# Patient Record
Sex: Male | Born: 1985 | Race: White | Hispanic: No | Marital: Married | Smoking: Never smoker
Health system: Southern US, Community
[De-identification: ages and names within clinical notes are randomized; demographics above are authoritative.]

## PROBLEM LIST (undated history)

## (undated) DIAGNOSIS — K219 Gastro-esophageal reflux disease without esophagitis: Secondary | ICD-10-CM

## (undated) DIAGNOSIS — G8929 Other chronic pain: Secondary | ICD-10-CM

## (undated) DIAGNOSIS — F909 Attention-deficit hyperactivity disorder, unspecified type: Secondary | ICD-10-CM

## (undated) DIAGNOSIS — M549 Dorsalgia, unspecified: Secondary | ICD-10-CM

## (undated) HISTORY — PX: MANDIBLE SURGERY: SHX707

## (undated) HISTORY — PX: WISDOM TOOTH EXTRACTION: SHX21

---

## 2013-04-04 ENCOUNTER — Observation Stay (HOSPITAL_COMMUNITY): Payer: Commercial Managed Care - PPO | Admitting: Anesthesiology

## 2013-04-04 ENCOUNTER — Emergency Department (HOSPITAL_COMMUNITY): Payer: Commercial Managed Care - PPO

## 2013-04-04 ENCOUNTER — Encounter (HOSPITAL_COMMUNITY): Admission: EM | Disposition: A | Discharge status: Home / Self Care | Payer: Self-pay | Source: Home / Self Care

## 2013-04-04 ENCOUNTER — Encounter (HOSPITAL_COMMUNITY): Payer: Self-pay | Admitting: Anesthesiology

## 2013-04-04 ENCOUNTER — Encounter (HOSPITAL_COMMUNITY): Payer: Self-pay | Admitting: Emergency Medicine

## 2013-04-04 ENCOUNTER — Observation Stay (HOSPITAL_COMMUNITY)
Admission: EM | Admit: 2013-04-04 | Discharge: 2013-04-05 | Disposition: A | Discharge status: Home / Self Care | Payer: Commercial Managed Care - PPO | Attending: General Surgery | Admitting: General Surgery

## 2013-04-04 DIAGNOSIS — K219 Gastro-esophageal reflux disease without esophagitis: Secondary | ICD-10-CM | POA: Insufficient documentation

## 2013-04-04 DIAGNOSIS — K3580 Unspecified acute appendicitis: Principal | ICD-10-CM

## 2013-04-04 DIAGNOSIS — R109 Unspecified abdominal pain: Secondary | ICD-10-CM

## 2013-04-04 DIAGNOSIS — D72829 Elevated white blood cell count, unspecified: Secondary | ICD-10-CM | POA: Insufficient documentation

## 2013-04-04 DIAGNOSIS — K37 Unspecified appendicitis: Secondary | ICD-10-CM

## 2013-04-04 DIAGNOSIS — Z79899 Other long term (current) drug therapy: Secondary | ICD-10-CM | POA: Insufficient documentation

## 2013-04-04 DIAGNOSIS — Z9049 Acquired absence of other specified parts of digestive tract: Secondary | ICD-10-CM

## 2013-04-04 HISTORY — DX: Other chronic pain: G89.29

## 2013-04-04 HISTORY — DX: Dorsalgia, unspecified: M54.9

## 2013-04-04 HISTORY — DX: Gastro-esophageal reflux disease without esophagitis: K21.9

## 2013-04-04 HISTORY — DX: Attention-deficit hyperactivity disorder, unspecified type: F90.9

## 2013-04-04 HISTORY — PX: LAPAROSCOPIC APPENDECTOMY: SHX408

## 2013-04-04 LAB — CBC WITH DIFFERENTIAL/PLATELET
Basophils Relative: 0 % (ref 0–1)
Eosinophils Relative: 1 % (ref 0–5)
HCT: 42.4 % (ref 39.0–52.0)
Hemoglobin: 14.7 g/dL (ref 13.0–17.0)
Lymphocytes Relative: 19 % (ref 12–46)
MCHC: 34.7 g/dL (ref 30.0–36.0)
MCV: 87.4 fL (ref 78.0–100.0)
Monocytes Absolute: 0.9 10*3/uL (ref 0.1–1.0)
Monocytes Relative: 7 % (ref 3–12)
Neutro Abs: 9.3 10*3/uL — ABNORMAL HIGH (ref 1.7–7.7)

## 2013-04-04 LAB — POCT I-STAT, CHEM 8
Calcium, Ion: 1.18 mmol/L (ref 1.12–1.23)
Glucose, Bld: 90 mg/dL (ref 70–99)
HCT: 45 % (ref 39.0–52.0)
Hemoglobin: 15.3 g/dL (ref 13.0–17.0)

## 2013-04-04 LAB — HEPATIC FUNCTION PANEL
ALT: 15 U/L (ref 0–53)
AST: 21 U/L (ref 0–37)
Bilirubin, Direct: <0.1 mg/dL (ref 0.0–0.3)
Total Bilirubin: 0.4 mg/dL (ref 0.3–1.2)

## 2013-04-04 SURGERY — APPENDECTOMY, LAPAROSCOPIC
Anesthesia: General | Site: Abdomen | Wound class: Contaminated

## 2013-04-04 MED ORDER — SODIUM CHLORIDE 0.9 % IV SOLN
3.0000 g | Freq: Four times a day (QID) | INTRAVENOUS | Status: DC
  Administered 2013-04-04 – 2013-04-05 (×5): 3 g via INTRAVENOUS
  Filled 2013-04-04 (×6): qty 3

## 2013-04-04 MED ORDER — LIDOCAINE HCL (CARDIAC) 20 MG/ML IV SOLN
INTRAVENOUS | Status: DC | PRN
  Administered 2013-04-04: 50 mg via INTRAVENOUS

## 2013-04-04 MED ORDER — ONDANSETRON HCL 4 MG/2ML IJ SOLN
INTRAMUSCULAR | Status: DC | PRN
  Administered 2013-04-04: 4 mg via INTRAVENOUS

## 2013-04-04 MED ORDER — ROCURONIUM BROMIDE 100 MG/10ML IV SOLN
INTRAVENOUS | Status: DC | PRN
  Administered 2013-04-04: 5 mg via INTRAVENOUS
  Administered 2013-04-04: 30 mg via INTRAVENOUS

## 2013-04-04 MED ORDER — SUCCINYLCHOLINE CHLORIDE 20 MG/ML IJ SOLN
INTRAMUSCULAR | Status: DC | PRN
  Administered 2013-04-04: 100 mg via INTRAVENOUS

## 2013-04-04 MED ORDER — MIDAZOLAM HCL 5 MG/5ML IJ SOLN
INTRAMUSCULAR | Status: DC | PRN
  Administered 2013-04-04: 2 mg via INTRAVENOUS

## 2013-04-04 MED ORDER — MORPHINE SULFATE 4 MG/ML IJ SOLN
4.0000 mg | Freq: Once | INTRAMUSCULAR | Status: AC
  Administered 2013-04-04: 4 mg via INTRAVENOUS
  Filled 2013-04-04: qty 1

## 2013-04-04 MED ORDER — HYDROMORPHONE HCL PF 1 MG/ML IJ SOLN
INTRAMUSCULAR | Status: AC
  Filled 2013-04-04: qty 1

## 2013-04-04 MED ORDER — HYDROCODONE-ACETAMINOPHEN 5-325 MG PO TABS
1.0000 | ORAL_TABLET | ORAL | Status: DC | PRN
  Administered 2013-04-04: 2 via ORAL
  Administered 2013-04-04 (×2): 1 via ORAL
  Administered 2013-04-05: 2 via ORAL
  Filled 2013-04-04: qty 2
  Filled 2013-04-04: qty 1
  Filled 2013-04-04: qty 2
  Filled 2013-04-04: qty 1

## 2013-04-04 MED ORDER — HYDROMORPHONE HCL PF 1 MG/ML IJ SOLN
1.0000 mg | Freq: Once | INTRAMUSCULAR | Status: AC
  Administered 2013-04-04: 1 mg via INTRAVENOUS
  Filled 2013-04-04: qty 1

## 2013-04-04 MED ORDER — IOHEXOL 300 MG/ML  SOLN
50.0000 mL | Freq: Once | INTRAMUSCULAR | Status: AC | PRN
  Administered 2013-04-04: 50 mL via ORAL

## 2013-04-04 MED ORDER — BUPIVACAINE-EPINEPHRINE PF 0.25-1:200000 % IJ SOLN
INTRAMUSCULAR | Status: AC
  Filled 2013-04-04: qty 30

## 2013-04-04 MED ORDER — PROPOFOL 10 MG/ML IV BOLUS
INTRAVENOUS | Status: DC | PRN
  Administered 2013-04-04: 150 mg via INTRAVENOUS

## 2013-04-04 MED ORDER — KCL IN DEXTROSE-NACL 30-5-0.45 MEQ/L-%-% IV SOLN
INTRAVENOUS | Status: DC
  Administered 2013-04-04 – 2013-04-05 (×2): via INTRAVENOUS
  Filled 2013-04-04 (×7): qty 1000

## 2013-04-04 MED ORDER — IBUPROFEN 600 MG PO TABS
600.0000 mg | ORAL_TABLET | Freq: Four times a day (QID) | ORAL | Status: DC | PRN
  Filled 2013-04-04: qty 1

## 2013-04-04 MED ORDER — BUPIVACAINE-EPINEPHRINE 0.25% -1:200000 IJ SOLN
INTRAMUSCULAR | Status: DC | PRN
  Administered 2013-04-04: 15 mL

## 2013-04-04 MED ORDER — ONDANSETRON HCL 4 MG PO TABS: 4.0000 mg | ORAL_TABLET | Freq: Four times a day (QID) | ORAL | Status: DC | PRN

## 2013-04-04 MED ORDER — KCL IN DEXTROSE-NACL 20-5-0.45 MEQ/L-%-% IV SOLN: INTRAVENOUS | Status: DC

## 2013-04-04 MED ORDER — FENTANYL CITRATE 0.05 MG/ML IJ SOLN
INTRAMUSCULAR | Status: DC | PRN
  Administered 2013-04-04: 100 ug via INTRAVENOUS
  Administered 2013-04-04 (×3): 50 ug via INTRAVENOUS

## 2013-04-04 MED ORDER — PROMETHAZINE HCL 25 MG/ML IJ SOLN: 6.2500 mg | INTRAMUSCULAR | Status: DC | PRN

## 2013-04-04 MED ORDER — MORPHINE SULFATE 2 MG/ML IJ SOLN
2.0000 mg | INTRAMUSCULAR | Status: DC | PRN
  Administered 2013-04-04 (×4): 2 mg via INTRAVENOUS
  Filled 2013-04-04 (×4): qty 1

## 2013-04-04 MED ORDER — LACTATED RINGERS IV SOLN
INTRAVENOUS | Status: DC | PRN
  Administered 2013-04-04: 11:00:00 via INTRAVENOUS

## 2013-04-04 MED ORDER — IOHEXOL 300 MG/ML  SOLN
100.0000 mL | Freq: Once | INTRAMUSCULAR | Status: AC | PRN
  Administered 2013-04-04: 100 mL via INTRAVENOUS

## 2013-04-04 MED ORDER — ONDANSETRON HCL 4 MG/2ML IJ SOLN
4.0000 mg | Freq: Once | INTRAMUSCULAR | Status: AC
  Administered 2013-04-04: 4 mg via INTRAVENOUS
  Filled 2013-04-04: qty 2

## 2013-04-04 MED ORDER — LACTATED RINGERS IR SOLN
Status: DC | PRN
  Administered 2013-04-04: 1000 mL

## 2013-04-04 MED ORDER — PHENYLEPHRINE HCL 10 MG/ML IJ SOLN
INTRAMUSCULAR | Status: DC | PRN
  Administered 2013-04-04: 40 ug via INTRAVENOUS

## 2013-04-04 MED ORDER — LACTATED RINGERS IV SOLN: INTRAVENOUS | Status: DC

## 2013-04-04 MED ORDER — GLYCOPYRROLATE 0.2 MG/ML IJ SOLN
INTRAMUSCULAR | Status: DC | PRN
  Administered 2013-04-04: .7 mg via INTRAVENOUS

## 2013-04-04 MED ORDER — HYDROMORPHONE HCL PF 1 MG/ML IJ SOLN
0.2500 mg | INTRAMUSCULAR | Status: DC | PRN
  Administered 2013-04-04 (×4): 0.5 mg via INTRAVENOUS

## 2013-04-04 MED ORDER — SODIUM CHLORIDE 0.9 % IV SOLN
INTRAVENOUS | Status: DC | PRN
  Administered 2013-04-04: 04:00:00 via INTRAVENOUS

## 2013-04-04 MED ORDER — SODIUM CHLORIDE 0.9 % IV SOLN: 3.0000 g | Freq: Four times a day (QID) | INTRAVENOUS | Status: DC

## 2013-04-04 MED ORDER — SODIUM CHLORIDE 0.9 % IV SOLN
Freq: Once | INTRAVENOUS | Status: AC
  Administered 2013-04-04: 04:00:00 via INTRAVENOUS

## 2013-04-04 MED ORDER — ONDANSETRON HCL 4 MG/2ML IJ SOLN
4.0000 mg | Freq: Four times a day (QID) | INTRAMUSCULAR | Status: DC | PRN
Start: 2013-04-04 — End: 2013-04-05
  Administered 2013-04-04: 4 mg via INTRAVENOUS
  Filled 2013-04-04: qty 2

## 2013-04-04 MED ORDER — NEOSTIGMINE METHYLSULFATE 1 MG/ML IJ SOLN
INTRAMUSCULAR | Status: DC | PRN
  Administered 2013-04-04: 4 mg via INTRAVENOUS

## 2013-04-04 SURGICAL SUPPLY — 38 items
APPLIER CLIP ROT 10 11.4 M/L (STAPLE)
BENZOIN TINCTURE PRP APPL 2/3 (GAUZE/BANDAGES/DRESSINGS) ×2 IMPLANT
CANISTER SUCTION 2500CC (MISCELLANEOUS) ×2 IMPLANT
CLIP APPLIE ROT 10 11.4 M/L (STAPLE) IMPLANT
CLOTH BEACON ORANGE TIMEOUT ST (SAFETY) ×2 IMPLANT
CUTTER FLEX LINEAR 45M (STAPLE) ×2 IMPLANT
DECANTER SPIKE VIAL GLASS SM (MISCELLANEOUS) IMPLANT
DRAPE LAPAROSCOPIC ABDOMINAL (DRAPES) ×2 IMPLANT
DRSG TEGADERM 2-3/8X2-3/4 SM (GAUZE/BANDAGES/DRESSINGS) ×6 IMPLANT
ELECT REM PT RETURN 9FT ADLT (ELECTROSURGICAL) ×2
ELECTRODE REM PT RTRN 9FT ADLT (ELECTROSURGICAL) ×1 IMPLANT
ENDOLOOP SUT PDS II  0 18 (SUTURE)
ENDOLOOP SUT PDS II 0 18 (SUTURE) IMPLANT
GLOVE BIOGEL PI IND STRL 7.0 (GLOVE) ×1 IMPLANT
GLOVE BIOGEL PI INDICATOR 7.0 (GLOVE) ×1
GLOVE SURG ORTHO 8.0 STRL STRW (GLOVE) ×2 IMPLANT
GOWN STRL NON-REIN LRG LVL3 (GOWN DISPOSABLE) ×2 IMPLANT
GOWN STRL REIN XL XLG (GOWN DISPOSABLE) ×2 IMPLANT
HAND ACTIVATED (MISCELLANEOUS) IMPLANT
KIT BASIN OR (CUSTOM PROCEDURE TRAY) ×2 IMPLANT
PENCIL BUTTON HOLSTER BLD 10FT (ELECTRODE) IMPLANT
POUCH SPECIMEN RETRIEVAL 10MM (ENDOMECHANICALS) IMPLANT
RELOAD 45 VASCULAR/THIN (ENDOMECHANICALS) IMPLANT
RELOAD STAPLE TA45 3.5 REG BLU (ENDOMECHANICALS) ×2 IMPLANT
SCALPEL HARMONIC ACE (MISCELLANEOUS) ×2 IMPLANT
SET IRRIG TUBING LAPAROSCOPIC (IRRIGATION / IRRIGATOR) ×2 IMPLANT
SOLUTION ANTI FOG 6CC (MISCELLANEOUS) ×2 IMPLANT
STRIP CLOSURE SKIN 1/2X4 (GAUZE/BANDAGES/DRESSINGS) ×2 IMPLANT
SUT MNCRL AB 4-0 PS2 18 (SUTURE) ×2 IMPLANT
TOWEL OR 17X26 10 PK STRL BLUE (TOWEL DISPOSABLE) ×2 IMPLANT
TRAY FOLEY CATH 14FRSI W/METER (CATHETERS) ×2 IMPLANT
TRAY LAP CHOLE (CUSTOM PROCEDURE TRAY) ×2 IMPLANT
TROCAR BLADELESS OPT 5 75 (ENDOMECHANICALS) ×2 IMPLANT
TROCAR XCEL 12X100 BLDLESS (ENDOMECHANICALS) ×2 IMPLANT
TROCAR XCEL BLUNT TIP 100MML (ENDOMECHANICALS) ×2 IMPLANT
TROCAR XCEL NON-BLD 11X100MML (ENDOMECHANICALS) IMPLANT
TUBING INSUFFLATION 10FT LAP (TUBING) ×2 IMPLANT
WATER STERILE IRR 1500ML POUR (IV SOLUTION) IMPLANT

## 2013-04-04 NOTE — ED Provider Notes (Signed)
7:04 AM Spoke with Surgery (Dr Gerrit Friends). Will see in the ER  Lyanne Co, MD 04/04/13 [phone]

## 2013-04-04 NOTE — ED Provider Notes (Signed)
Medical screening examination/treatment/procedure(s) were conducted as a shared visit with non-physician practitioner(s) and myself.  I personally evaluated the patient during the encounter  CT scan demonstrates appendicitis.  Tenderness in the right lower quadrant.  Surgery to evaluate  1. Appendicitis    Ct Abdomen Pelvis W Contrast  04/04/2013   *RADIOLOGY REPORT*  Clinical Data: Right lower quadrant abdominal pain and leukocytosis.  CT ABDOMEN AND PELVIS WITH CONTRAST  Technique:  Multidetector CT imaging of the abdomen and pelvis was performed following the standard protocol during bolus administration of intravenous contrast.  Contrast: OMNIPAQUE IOHEXOL 300 MG/ML  SOLN  Comparison: None.  Findings: The visualized lung bases are clear.  The liver and spleen are unremarkable in appearance.  The gallbladder is within normal limits.  The pancreas and adrenal glands are unremarkable.  The kidneys are unremarkable in appearance.  There is no evidence of hydronephrosis.  No renal or ureteral stones are seen.  No perinephric stranding is appreciated.  No free fluid is identified.  The small bowel is unremarkable in appearance.  The stomach is within normal limits.  No acute vascular abnormalities are seen.  The appendix is dilated distally to 1.6 cm in maximal diameter, with an obstructing 1.0 cm appendicolith at the mid appendix. Surrounding soft tissue inflammation is noted, with associated pericecal lymphadenopathy.  Findings are compatible with acute appendicitis.  There is no evidence of perforation or abscess formation at this time.  The colon is unremarkable in appearance.  The bladder is mildly distended and grossly unremarkable in appearance.  The prostate remains normal in size.  No inguinal lymphadenopathy is seen.  No acute osseous abnormalities are identified.  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  IMPRESSION:  1.  Acute appendicitis, with dilatation of the distal appendix to 1.6 cm, and an obstructing 1.0 cm appendicolith at the mid appendix.  Surrounding soft tissue inflammation and associated pericecal lymphadenopathy.  No evidence of perforation or abscess formation at this time. 2.  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  These results were called by telephone on 04/04/2013 at 06:45 a.m. to Dr. Azalia Bilis, who verbally acknowledged these results.   Original Report Authenticated By: Tonia Ghent, M.D.   I personally reviewed the imaging tests through PACS system I reviewed available ER/hospitalization records through the EMR   Lyanne Co, MD 04/04/13 [phone]

## 2013-04-04 NOTE — Anesthesia Postprocedure Evaluation (Signed)
  Anesthesia Post-op Note  Patient: Gregg King  Procedure(s) Performed: Procedure(s) (LRB): APPENDECTOMY LAPAROSCOPIC (N/A)  Patient Location: PACU  Anesthesia Type: General  Level of Consciousness: awake and alert   Airway and Oxygen Therapy: Patient Spontanous Breathing  Post-op Pain: mild  Post-op Assessment: Post-op Vital signs reviewed, Patient's Cardiovascular Status Stable, Respiratory Function Stable, Patent Airway and No signs of Nausea or vomiting  Last Vitals:  Filed Vitals:   04/04/13 1115  BP: 112/73  Pulse: 80  Temp: 36.8 C  Resp: 16    Post-op Vital Signs: stable   Complications: No apparent anesthesia complications

## 2013-04-04 NOTE — Progress Notes (Signed)
pacu nursing -  Glasses returned to patient.  Placed on face Parents invited in to see patient

## 2013-04-04 NOTE — ED Notes (Signed)
Pt c/o mid to RLQ pain onset 2300, + nausea.

## 2013-04-04 NOTE — Anesthesia Preprocedure Evaluation (Signed)
Anesthesia Evaluation  Patient identified by MRN, date of birth, ID band Patient awake    Reviewed: Allergy & Precautions, H&P , NPO status , Patient's Chart, lab work & pertinent test results  Airway Mallampati: II TM Distance: >3 FB Neck ROM: Full    Dental  (+) Teeth Intact and Dental Advisory Given   Pulmonary neg pulmonary ROS,  breath sounds clear to auscultation  Pulmonary exam normal       Cardiovascular negative cardio ROS  Rhythm:Regular Rate:Normal     Neuro/Psych negative neurological ROS  negative psych ROS   GI/Hepatic Neg liver ROS, GERD-  Medicated and Controlled,  Endo/Other  negative endocrine ROS  Renal/GU negative Renal ROS  negative genitourinary   Musculoskeletal negative musculoskeletal ROS (+)   Abdominal   Peds  Hematology negative hematology ROS (+)   Anesthesia Other Findings   Reproductive/Obstetrics                           Anesthesia Physical Anesthesia Plan  ASA: I and emergent  Anesthesia Plan: General   Post-op Pain Management:    Induction: Intravenous, Rapid sequence and Cricoid pressure planned  Airway Management Planned: Oral ETT  Additional Equipment:   Intra-op Plan:   Post-operative Plan: Extubation in OR  Informed Consent: I have reviewed the patients History and Physical, chart, labs and discussed the procedure including the risks, benefits and alternatives for the proposed anesthesia with the patient or authorized representative who has indicated his/her understanding and acceptance.   Dental advisory given  Plan Discussed with: CRNA  Anesthesia Plan Comments:         Anesthesia Quick Evaluation

## 2013-04-04 NOTE — H&P (Signed)
Gregg King is an 27 y.o. male.    General Surgery [facility]  Chief Complaint: abdominal pain, acute appendicitis  HPI: the patient is a 27 year old white male who presents to the emergency department with less than 24-hour history of generalized abdominal pain localizing to the right lower quadrant. The patient experienced mild nausea but no emesis. He denies fevers or chills. He is accompanied by his wife. Evaluation in the emergency department shows an elevated white blood cell count of 12.7. CT scan of the abdomen and pelvis shows findings consistent with acute appendicitis with appendicolith. General surgery is now called for management.  Past Medical History  Diagnosis Date  . Chronic back pain   . GERD (gastroesophageal reflux disease)   . ADHD (attention deficit hyperactivity disorder)     Past Surgical History  Procedure Laterality Date  . Wisdom tooth extraction    . Mandible surgery      No family history on file. Social History:  reports that he has never smoked. He does not have any smokeless tobacco history on file. He reports that  drinks alcohol. He reports that he does not use illicit drugs.  Allergies:  Allergies  Allergen Reactions  . Ceclor (Cefaclor) Nausea And Vomiting     (Not in a hospital admission)  Results for orders placed during the hospital encounter of 04/04/13 (from the past 48 hour(s))  CBC WITH DIFFERENTIAL     Status: Abnormal   Collection Time    04/04/13  4:20 AM      Result Value Range   WBC 12.7 (*) 4.0 - 10.5 K/uL   RBC 4.85  4.22 - 5.81 MIL/uL   Hemoglobin 14.7  13.0 - 17.0 g/dL   HCT 16.1  09.6 - 04.5 %   MCV 87.4  78.0 - 100.0 fL   MCH 30.3  26.0 - 34.0 pg   MCHC 34.7  30.0 - 36.0 g/dL   RDW 40.9  81.1 - 91.4 %   Platelets 198  150 - 400 K/uL   Neutrophils Relative % 73  43 - 77 %   Neutro Abs 9.3 (*) 1.7 - 7.7 K/uL   Lymphocytes Relative 19  12 - 46 %   Lymphs Abs 2.4  0.7 - 4.0 K/uL   Monocytes  Relative 7  3 - 12 %   Monocytes Absolute 0.9  0.1 - 1.0 K/uL   Eosinophils Relative 1  0 - 5 %   Eosinophils Absolute 0.2  0.0 - 0.7 K/uL   Basophils Relative 0  0 - 1 %   Basophils Absolute 0.0  0.0 - 0.1 K/uL  HEPATIC FUNCTION PANEL     Status: None   Collection Time    04/04/13  4:20 AM      Result Value Range   Total Protein 7.2  6.0 - 8.3 g/dL   Albumin 4.0  3.5 - 5.2 g/dL   AST 21  0 - 37 U/L   ALT 15  0 - 53 U/L   Alkaline Phosphatase 72  39 - 117 U/L   Total Bilirubin 0.4  0.3 - 1.2 mg/dL   Bilirubin, Direct <7.8  0.0 - 0.3 mg/dL   Indirect Bilirubin NOT CALCULATED  0.3 - 0.9 mg/dL  POCT I-STAT, CHEM 8     Status: None   Collection Time    04/04/13  4:41 AM      Result Value Range   Sodium 142  135 - 145 mEq/L  Potassium 3.9  3.5 - 5.1 mEq/L   Chloride 104  96 - 112 mEq/L   BUN 10  6 - 23 mg/dL   Creatinine, Ser 1.61  0.50 - 1.35 mg/dL   Glucose, Bld 90  70 - 99 mg/dL   Calcium, Ion 0.96  0.45 - 1.23 mmol/L   TCO2 31  0 - 100 mmol/L   Hemoglobin 15.3  13.0 - 17.0 g/dL   HCT 40.9  81.1 - 91.4 %   Ct Abdomen Pelvis W Contrast  04/04/2013   *RADIOLOGY REPORT*  Clinical Data: Right lower quadrant abdominal pain and leukocytosis.  CT ABDOMEN AND PELVIS WITH CONTRAST  Technique:  Multidetector CT imaging of the abdomen and pelvis was performed following the standard protocol during bolus administration of intravenous contrast.  Contrast: OMNIPAQUE IOHEXOL 300 MG/ML  SOLN  Comparison: None.  Findings: The visualized lung bases are clear.  The liver and spleen are unremarkable in appearance.  The gallbladder is within normal limits.  The pancreas and adrenal glands are unremarkable.  The kidneys are unremarkable in appearance.  There is no evidence of hydronephrosis.  No renal or ureteral stones are seen.  No perinephric stranding is appreciated.  No free fluid is identified.  The small bowel is unremarkable in appearance.  The stomach is within normal limits.  No acute  vascular abnormalities are seen.  The appendix is dilated distally to 1.6 cm in maximal diameter, with an obstructing 1.0 cm appendicolith at the mid appendix. Surrounding soft tissue inflammation is noted, with associated pericecal lymphadenopathy.  Findings are compatible with acute appendicitis.  There is no evidence of perforation or abscess formation at this time.  The colon is unremarkable in appearance.  The bladder is mildly distended and grossly unremarkable in appearance.  The prostate remains normal in size.  No inguinal lymphadenopathy is seen.  No acute osseous abnormalities are identified.  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  IMPRESSION:  1.  Acute appendicitis, with dilatation of the distal appendix to 1.6 cm, and an obstructing 1.0 cm appendicolith at the mid appendix.  Surrounding soft tissue inflammation and associated pericecal lymphadenopathy.  No evidence of perforation or abscess formation at this time. 2.  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  These results were called by telephone on 04/04/2013 at 06:45 a.m. to Dr. Azalia Bilis, who verbally acknowledged these results.   Original Report Authenticated By: Tonia Ghent, M.D.    Review of Systems  Constitutional: Negative.   HENT: Negative.   Eyes: Negative.   Respiratory: Negative.   Cardiovascular: Negative.   Gastrointestinal: Positive for heartburn, nausea and abdominal pain (generalized localizing to RLQ). Negative for vomiting, diarrhea, constipation, blood in stool and melena.  Genitourinary: Negative.   Musculoskeletal: Negative.   Skin: Negative.   Neurological: Negative.   Endo/Heme/Allergies: Negative.   Psychiatric/Behavioral: Negative.     Blood pressure 125/82, pulse 87, temperature 99 F (37.2 C), temperature source Oral, resp. rate  18, height 6' (1.829 m), weight 185 lb (83.915 kg), SpO2 99.00%. Physical Exam  Constitutional: He is oriented to person, place, and time. He appears well-developed and well-nourished. No distress.  HENT:  Head: Normocephalic and atraumatic.  Right Ear: External ear normal.  Mouth/Throat: Oropharynx is clear and moist.  Eyes: Conjunctivae are normal. Pupils are equal, round, and reactive to light.  No scleral icterus.  Neck: Normal range of motion. Neck supple. No tracheal deviation present. No thyromegaly present.  Cardiovascular: Normal rate, regular rhythm and normal heart sounds.   No murmur heard. Respiratory: Effort normal and breath sounds normal. He has no wheezes.  GI: Soft. Bowel sounds are normal. He exhibits no distension and no mass. There is tenderness (RLQ tenderness with guarding, no mass). There is guarding. There is no rebound.  Musculoskeletal: Normal range of motion. He exhibits no edema.  Neurological: He is alert and oriented to person, place, and time.  Skin: Skin is warm and dry.  Psychiatric: He has a normal mood and affect. His behavior is normal.     Assessment/Plan Acute appendicitis  Admit to general surgery service  Initiate IV abx with Unasyn  To OR this AM for lap appendectomy  The risks and benefits of the procedure have been discussed at length with the patient.  The patient understands the proposed procedure, potential alternative treatments, and the course of recovery to be expected.  All of the patient's questions have been answered at this time.  The patient wishes to proceed with surgery.  Velora Heckler, MD, [facility] Office: [phone]    Wilfred Dayrit Judie Petit 04/04/2013, 8:25 AM

## 2013-04-04 NOTE — Transfer of Care (Signed)
Immediate Anesthesia Transfer of Care Note  Patient: Gregg King  Procedure(s) Performed: Procedure(s) (LRB): APPENDECTOMY LAPAROSCOPIC (N/A)  Patient Location: PACU  Anesthesia Type: General  Level of Consciousness: sedated, patient cooperative and responds to stimulaton  Airway & Oxygen Therapy: Patient Spontanous Breathing and Patient connected to face mask oxgen  Post-op Assessment: Report given to PACU RN and Post -op Vital signs reviewed and stable  Post vital signs: Reviewed and stable  Complications: No apparent anesthesia complications

## 2013-04-04 NOTE — ED Provider Notes (Signed)
History     CSN: [id number]  Arrival date & time 04/04/13  0335   First MD Initiated Contact with Patient 04/04/13 [phone]      Chief Complaint  Patient presents with  . Abdominal Pain    (Consider location/radiation/quality/duration/timing/severity/associated sxs/prior treatment) HPI Comments: Patient with generalized abdominal pain starting about midnight tried taking his nightly meds including Flexeril and Prilosec only to have the pain worsen has had several episodes of vomiting, feeling lightheaded and localization of his pain to RLQ  Patient is a 27 y.o. male presenting with abdominal pain. The history is provided by the patient.  Abdominal Pain This is a new problem. The current episode started today. The problem occurs constantly. The problem has been gradually worsening. Associated symptoms include abdominal pain, nausea, vomiting and weakness. Pertinent negatives include no chills, coughing, fever, headaches, rash or urinary symptoms. The symptoms are aggravated by exertion. He has tried lying down for the symptoms. The treatment provided no relief.    Past Medical History  Diagnosis Date  . Chronic back pain   . GERD (gastroesophageal reflux disease)   . ADHD (attention deficit hyperactivity disorder)     Past Surgical History  Procedure Laterality Date  . Wisdom tooth extraction    . Mandible surgery      History reviewed. No pertinent family history.  History  Substance Use Topics  . Smoking status: Never Smoker   . Smokeless tobacco: Never Used  . Alcohol Use: Yes     Comment: rare      Review of Systems  Unable to perform ROS Constitutional: Negative for fever and chills.  Respiratory: Negative for cough and shortness of breath.   Gastrointestinal: Positive for nausea, vomiting and abdominal pain. Negative for diarrhea, constipation and blood in stool.  Genitourinary: Negative for dysuria, discharge and testicular pain.  Skin: Negative for rash.   Neurological: Positive for weakness. Negative for dizziness and headaches.  All other systems reviewed and are negative.    Allergies  Ceclor  Home Medications   No current outpatient prescriptions on file.  BP 98/64  Pulse 86  Temp(Src) 97.5 F (36.4 C) (Oral)  Resp 16  Ht 6' (1.829 m)  Wt 185 lb (83.915 kg)  BMI 25.08 kg/m2  SpO2 97%  Physical Exam  Nursing note and vitals reviewed. Constitutional: He is oriented to person, place, and time. He appears well-developed and well-nourished.  HENT:  Head: Normocephalic.  Eyes: Pupils are equal, round, and reactive to light.  Neck: Normal range of motion.  Cardiovascular: Normal rate and regular rhythm.   Pulmonary/Chest: Effort normal.  Abdominal: Soft. He exhibits no distension. There is no hepatosplenomegaly. There is tenderness in the right lower quadrant and suprapubic area. There is no rebound.  Musculoskeletal: Normal range of motion.  Neurological: He is alert and oriented to person, place, and time.  Skin: Skin is warm and dry. There is pallor.    ED Course  Procedures (including critical care time)  Labs Reviewed  CBC WITH DIFFERENTIAL - Abnormal; Notable for the following:    WBC 12.7 (*)    Neutro Abs 9.3 (*)    All other components within normal limits  HEPATIC FUNCTION PANEL  POCT I-STAT, CHEM 8   Ct Abdomen Pelvis W Contrast  04/04/2013   *RADIOLOGY REPORT*  Clinical Data: Right lower quadrant abdominal pain and leukocytosis.  CT ABDOMEN AND PELVIS WITH CONTRAST  Technique:  Multidetector CT imaging of the abdomen and pelvis was performed following the  standard protocol during bolus administration of intravenous contrast.  Contrast: OMNIPAQUE IOHEXOL 300 MG/ML  SOLN  Comparison: None.  Findings: The visualized lung bases are clear.  The liver and spleen are unremarkable in appearance.  The gallbladder is within normal limits.  The pancreas and adrenal glands are unremarkable.  The kidneys are  unremarkable in appearance.  There is no evidence of hydronephrosis.  No renal or ureteral stones are seen.  No perinephric stranding is appreciated.  No free fluid is identified.  The small bowel is unremarkable in appearance.  The stomach is within normal limits.  No acute vascular abnormalities are seen.  The appendix is dilated distally to 1.6 cm in maximal diameter, with an obstructing 1.0 cm appendicolith at the mid appendix. Surrounding soft tissue inflammation is noted, with associated pericecal lymphadenopathy.  Findings are compatible with acute appendicitis.  There is no evidence of perforation or abscess formation at this time.  The colon is unremarkable in appearance.  The bladder is mildly distended and grossly unremarkable in appearance.  The prostate remains normal in size.  No inguinal lymphadenopathy is seen.  No acute osseous abnormalities are identified.  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  IMPRESSION:  1.  Acute appendicitis, with dilatation of the distal appendix to 1.6 cm, and an obstructing 1.0 cm appendicolith at the mid appendix.  Surrounding soft tissue inflammation and associated pericecal lymphadenopathy.  No evidence of perforation or abscess formation at this time. 2.  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  These results were called by telephone on 04/04/2013 at 06:45 a.m. to Dr. Azalia Bilis, who verbally acknowledged these results.   Original Report Authenticated By: Tonia Ghent, M.D.     1. Appendicitis       MDM  Denies UTI symptoms, testicular discomfort penile discharge or dysuria          Arman Filter, NP 04/04/13 2057

## 2013-04-04 NOTE — Op Note (Signed)
OPERATIVE REPORT - LAPAROSCOPIC APPENDECTOMY  Preop diagnosis: Acute appendicitis  Postop diagnosis: Same  Procedure: Laparoscopic appendectomy  Surgeon:  Velora Heckler, MD, FACS  Anesthesia: General endotracheal  Estimated blood loss: Minimal  Preparation: Chlora-prep  Complications: None  Indications:  Patient is a 27 yo WM who presented to ER with less than 24 hour history of abdominal pain localizing to RLQ.  WBC elevated at 12.7.  CTA shows appendicolith with signs of acute appendicitis.  Now for lap appendectomy.  Procedure:  Patient is brought to the operating room and placed in a supine position on the operating room table. Following administration of general anesthesia, a time out was held and the patient's name and type of procedure is confirmed. Patient is then prepped and draped in the usual strict aseptic fashion.  After ascertaining that an adequate level of anesthesia been achieved, a peri-umbilical incision is made with a #15 blade. Dissection is carried down to the fascia. Fascia is incised in the midline and the peritoneal cavity is entered cautiously. A #0-vicryl pursestring suture is placed in the fascia. An Hassan cannula is introduced under direct vision and secured with the pursestring suture. Abdomen is insufflated with carbon dioxide. Laparoscope is introduced and the abdomen is explored. Operative ports are placed in the right upper quadrant and left lower quadrant. Appendix is identified.  Dissection is carried down to the base of the appendix. The base of the appendix at its junction with the cecal wall was clearly identified. Using an Endo GIA stapler the base of the appendix is transected at its junction with the cecal wall. There is good approximation of tissue along the staple line. There is good hemostasis along the staple line.   The appendix is acutely inflamed and adherent to the mesentery of the terminal ileum and retroperitoneum.  With careful dissection,  the mesoappendix is dissected out and transected with the Harmonic Scalpel.  Good hemostasis is noted. There is no abscess present.  The appendix is placed into an endo-catch bag and withdrawn through the umbilical port without difficulty. The #0-vicryl pursestring suture is tied securely.  Right lower quadrant is irrigated with warm saline which is evacuated. Good hemostasis is noted. Ports are removed under direct vision. Good hemostasis is noted at the port sites. Pneumoperitoneum is released.  Skin incisions are anesthetized with local anesthetic. Wounds are closed with interrupted 4-0 Monocryl subcuticular sutures. Wounds were washed and dried and benzoin and Steri-Strips are applied. Sterile dressings are applied. Patient is awakened from anesthesia and brought to the recovery room. The patient tolerated the procedure well.  Velora Heckler, MD, [facility] Office: [phone]

## 2013-04-04 NOTE — Progress Notes (Signed)
Glasses taken to PACU, pt. Family unavailable.

## 2013-04-05 ENCOUNTER — Encounter (HOSPITAL_COMMUNITY): Payer: Self-pay | Admitting: Surgery

## 2013-04-05 MED ORDER — HYDROCODONE-ACETAMINOPHEN 5-325 MG PO TABS: 1.0000 | ORAL_TABLET | ORAL | Status: DC | PRN

## 2013-04-05 MED ORDER — IBUPROFEN 600 MG PO TABS: 600.0000 mg | ORAL_TABLET | Freq: Four times a day (QID) | ORAL | Status: DC | PRN

## 2013-04-05 NOTE — Discharge Summary (Signed)
General Surgery [facility]  Reviewed.  Agree with above.  Velora Heckler, MD, [facility] Office: [phone]

## 2013-04-05 NOTE — Discharge Summary (Signed)
Physician Discharge Summary  Patient ID: Gregg King MRN: [id number] DOB/AGE: August 26, 1986 26 y.o.  Admit date: 04/04/2013 Discharge date: 04/05/2013  Admitting Diagnosis: Acute Appendicitis   Discharge Diagnosis Patient Active Problem List   Diagnosis Date Noted  . Appendicitis, acute 04/04/2013    Consultants none  Imaging: Ct Abdomen Pelvis W Contrast  04/04/2013   *RADIOLOGY REPORT*  Clinical Data: Right lower quadrant abdominal pain and leukocytosis.  CT ABDOMEN AND PELVIS WITH CONTRAST  Technique:  Multidetector CT imaging of the abdomen and pelvis was performed following the standard protocol during bolus administration of intravenous contrast.  Contrast: OMNIPAQUE IOHEXOL 300 MG/ML  SOLN  Comparison: None.  Findings: The visualized lung bases are clear.  The liver and spleen are unremarkable in appearance.  The gallbladder is within normal limits.  The pancreas and adrenal glands are unremarkable.  The kidneys are unremarkable in appearance.  There is no evidence of hydronephrosis.  No renal or ureteral stones are seen.  No perinephric stranding is appreciated.  No free fluid is identified.  The small bowel is unremarkable in appearance.  The stomach is within normal limits.  No acute vascular abnormalities are seen.  The appendix is dilated distally to 1.6 cm in maximal diameter, with an obstructing 1.0 cm appendicolith at the mid appendix. Surrounding soft tissue inflammation is noted, with associated pericecal lymphadenopathy.  Findings are compatible with acute appendicitis.  There is no evidence of perforation or abscess formation at this time.  The colon is unremarkable in appearance.  The bladder is mildly distended and grossly unremarkable in appearance.  The prostate remains normal in size.  No inguinal lymphadenopathy is seen.  No acute osseous abnormalities are identified.  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  IMPRESSION:  1.  Acute appendicitis, with dilatation of the distal appendix to 1.6 cm, and an obstructing 1.0 cm appendicolith at the mid appendix.  Surrounding soft tissue inflammation and associated pericecal lymphadenopathy.  No evidence of perforation or abscess formation at this time. 2.  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  These results were called by telephone on 04/04/2013 at 06:45 a.m. to Dr. Azalia Bilis, who verbally acknowledged these results.   Original Report Authenticated By: Tonia Ghent, M.D.    Procedures Laparoscopic Appendectomy(Dr. 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  Workup showed acute appendicitis.  Patient was admitted and underwent a laparoscopic appendectomy  Tolerated procedure well and was transferred to the floor.  Diet was advanced as tolerated.  On POD #2, the patient was voiding well, tolerating diet, ambulating well, pain well controlled, vital signs stable, incisions c/d/i and felt stable for discharge home.  A follow up appointment has been made for the patient.  He was given activity restrictions and prescription for pain medication.  Medication adverse effects discussed.  Warning signs that warrant immediate attention were discussed.  He was discharged home with wife.   Physical Exam: General:  Alert, NAD, pleasant, comfortable Abd:  Soft, ND, mild tenderness, incisions C/D/I, no drainage.     Medication List    TAKE these medications       amphetamine-dextroamphetamine 30 MG 24 hr capsule  Commonly known as:  ADDERALL XR  Take 30 mg by mouth every morning.  amphetamine-dextroamphetamine 15 MG 24 hr capsule  Commonly known as:  ADDERALL XR  Take 15 mg by mouth at bedtime. And stated could take another one in evening if needed      cetirizine 10 MG tablet  Commonly known as:  ZYRTEC  Take 10 mg by mouth daily.     cyclobenzaprine 10 MG tablet  Commonly known as:  FLEXERIL  Take 10 mg by mouth at bedtime. Jaw and back pain     fluticasone 50 MCG/ACT nasal spray  Commonly known as:  FLONASE  Place 2 sprays into the nose daily.     HYDROcodone-acetaminophen 5-325 MG per tablet  Commonly known as:  NORCO/VICODIN  Take 1-2 tablets by mouth every 4 (four) hours as needed.     ibuprofen 600 MG tablet  Commonly known as:  ADVIL,MOTRIN  Take 1 tablet (600 mg total) by mouth every 6 (six) hours as needed for pain (mild pain).     omeprazole 20 MG capsule  Commonly known as:  PRILOSEC  Take 20 mg by mouth daily.     traMADol 50 MG tablet  Commonly known as:  ULTRAM  Take 50 mg by mouth every 6 (six) hours as needed for pain (jaw  pain).             Follow-up Information   Follow up with [facility] On 04/26/2013. (Appointment time 11am. This is a post operative check with a physician assistant)    Contact information:   [address]   [place] [address] [phone]       Signed: Ashok Norris, [facility] [phone]  04/05/2013, 8:44 AM

## 2013-04-06 NOTE — ED Provider Notes (Signed)
Medical screening examination/treatment/procedure(s) were performed by non-physician practitioner and as supervising physician I was immediately available for consultation/collaboration.  Lyanne Co, MD 04/06/13 [phone]

## 2013-04-07 ENCOUNTER — Telehealth (INDEPENDENT_AMBULATORY_CARE_PROVIDER_SITE_OTHER): Payer: Self-pay | Admitting: *Deleted

## 2013-04-07 NOTE — Telephone Encounter (Signed)
Patient called to ask about a refill of his pain medication but asking for something that doesn't contain Tylenol.  Gerkin MD paged at this time.

## 2013-04-07 NOTE — Telephone Encounter (Signed)
Dr. Gerrit Friends stated to prescribe Vicaprofin 7.5/200mg  1 tablet every 6 hours as needed for pain. #20 no refills.  Gerkin MD states this should take care of the patients pain so no more refills for narcotics are necessary following this refill. [facility] [phone]

## 2013-04-14 ENCOUNTER — Telehealth (INDEPENDENT_AMBULATORY_CARE_PROVIDER_SITE_OTHER): Payer: Self-pay | Admitting: *Deleted

## 2013-04-14 NOTE — Telephone Encounter (Signed)
error 

## 2013-04-14 NOTE — Telephone Encounter (Addendum)
Spoke to Gerkin MD who stated if patient was having that much pain then he would need to be evaluated in urgent office tomorrow but no more refills for pain medicine will be given at this time.  Patient states he would like to be seen in urgent office and an appt scheduled for patient at this time.

## 2013-04-14 NOTE — Telephone Encounter (Signed)
Patient called to state that he has tried to go without the narcotic and just use Naproxen 440mg  however this does not relieve his pain.  Explained to patient that this RN has spoke to Gerkin MD previously regarding his narcotic medication and was told the last time when he approved a refill that, that would be the last one.  Patient states understanding however states he has tried everything OTC and nothing relieves his pain.  Patient is asked for a few more of the Vicoprofin or something even less strong but that will help relieve his pain.

## 2013-04-15 ENCOUNTER — Encounter (INDEPENDENT_AMBULATORY_CARE_PROVIDER_SITE_OTHER): Payer: Self-pay | Admitting: General Surgery

## 2013-04-15 ENCOUNTER — Ambulatory Visit (INDEPENDENT_AMBULATORY_CARE_PROVIDER_SITE_OTHER): Payer: Commercial Managed Care - PPO | Admitting: General Surgery

## 2013-04-15 VITALS — BP 126/82 | HR 78 | Temp 98.0°F | Resp 18 | Ht 72.5 in | Wt 180.0 lb

## 2013-04-15 DIAGNOSIS — R1013 Epigastric pain: Secondary | ICD-10-CM

## 2013-04-15 NOTE — Progress Notes (Signed)
Gregg King is a 27 y.o. male who is status post a lap appy on 5/26.  He c/o pain in his mid abd.  This has gotten worse since he increased his activity this week.  He denies any constipation, diarrhea, nausea, vomiting or fevers.  Objective: Filed Vitals:   04/15/13 1539  BP: 126/82  Pulse: 78  Temp: 98 F (36.7 C)  Resp: 18    General appearance: alert and cooperative GI: soft, TTP in LLQ  Incision: healing well, no significant erythema   Assessment: s/p  Patient Active Problem List   Diagnosis Date Noted  . Appendicitis, acute 04/04/2013  . S/P laparoscopic appendectomy 04/04/2013    Plan: No signs of wound infection or intra-abd pathology.  Recommended that he decrease his activity for a few days.  F/U with Dr Gerrit Friends.    Vanita Panda, MD [facility], [place] [phone]   04/15/2013 3:56 PM

## 2013-04-15 NOTE — Patient Instructions (Signed)
We will call you with a f/u apt with Dr Gerrit Friends

## 2013-04-20 ENCOUNTER — Telehealth (INDEPENDENT_AMBULATORY_CARE_PROVIDER_SITE_OTHER): Payer: Self-pay

## 2013-04-20 NOTE — Telephone Encounter (Signed)
Per Dr Maisie Fus request I have made appt for pt to see Dr Gerrit Friends 04-21-13 arrive at 3:45pm. LMOM.

## 2013-04-21 ENCOUNTER — Encounter (INDEPENDENT_AMBULATORY_CARE_PROVIDER_SITE_OTHER): Payer: Self-pay | Admitting: Surgery

## 2013-04-21 ENCOUNTER — Encounter (INDEPENDENT_AMBULATORY_CARE_PROVIDER_SITE_OTHER): Payer: Commercial Managed Care - PPO | Admitting: Surgery

## 2013-04-21 ENCOUNTER — Ambulatory Visit (INDEPENDENT_AMBULATORY_CARE_PROVIDER_SITE_OTHER): Payer: Commercial Managed Care - PPO | Admitting: Surgery

## 2013-04-21 VITALS — BP 110/72 | HR 64 | Temp 98.9°F | Resp 18 | Ht 72.0 in | Wt 185.0 lb

## 2013-04-21 DIAGNOSIS — Z9049 Acquired absence of other specified parts of digestive tract: Secondary | ICD-10-CM

## 2013-04-21 DIAGNOSIS — Z9889 Other specified postprocedural states: Secondary | ICD-10-CM

## 2013-04-21 NOTE — Patient Instructions (Signed)
  COCOA BUTTER & VITAMIN E CREAM  ([employer] or other brand)  Apply cocoa butter/vitamin E cream to your incision 2 - 3 times daily.  Massage cream into incision for one minute with each application.  Use sunscreen (50 SPF or higher) for first 6 months after surgery if area is exposed to sun.  You may substitute Mederma or other scar reducing creams as desired.   

## 2013-04-21 NOTE — Progress Notes (Signed)
General Surgery [facility]  Visit Diagnoses: 1. S/P laparoscopic appendectomy     HISTORY: Patient returns for postoperative visit. He underwent laparoscopic appendectomy on May 26. He returned to our office with mid abdominal pain was evaluated by my partner. He returns today for a final postop visit.  Patient states that he is eating a normal diet. He is having bowel movements. Pain has largely resolved. He denies fever.  EXAM: Abdomen is soft nontender without distention. Surgical wounds are well healed. No sign of infection. No sign of herniation. Mild tenderness to deep palpation in the right lower quadrant. No palpable masses.  IMPRESSION: Status post laparoscopic appendectomy for acute appendicitis  PLAN: Patient will begin applying topical creams to his incisions. He is released to full activity without restriction. He will return for surgical care as needed.  Velora Heckler, MD, FACS General & Endocrine Surgery [facility]

## 2013-04-26 ENCOUNTER — Encounter (INDEPENDENT_AMBULATORY_CARE_PROVIDER_SITE_OTHER): Payer: Commercial Managed Care - PPO

## 2014-01-16 ENCOUNTER — Other Ambulatory Visit: Payer: Self-pay | Admitting: Family Medicine

## 2014-01-16 ENCOUNTER — Ambulatory Visit
Admission: RE | Admit: 2014-01-16 | Discharge: 2014-01-16 | Disposition: A | Discharge status: Home / Self Care | Payer: BC Managed Care – PPO | Source: Ambulatory Visit | Attending: Family Medicine | Admitting: Family Medicine

## 2014-01-16 DIAGNOSIS — M79605 Pain in left leg: Secondary | ICD-10-CM

## 2014-01-16 DIAGNOSIS — M7989 Other specified soft tissue disorders: Secondary | ICD-10-CM

## 2014-01-16 DIAGNOSIS — T148XXA Other injury of unspecified body region, initial encounter: Secondary | ICD-10-CM

## 2014-01-17 ENCOUNTER — Other Ambulatory Visit: Payer: Self-pay | Admitting: Family Medicine

## 2014-01-17 DIAGNOSIS — R1909 Other intra-abdominal and pelvic swelling, mass and lump: Secondary | ICD-10-CM

## 2014-01-18 ENCOUNTER — Other Ambulatory Visit: Payer: BC Managed Care – PPO

## 2014-01-18 ENCOUNTER — Ambulatory Visit
Admission: RE | Admit: 2014-01-18 | Discharge: 2014-01-18 | Disposition: A | Discharge status: Home / Self Care | Payer: Self-pay | Source: Ambulatory Visit | Attending: Family Medicine | Admitting: Family Medicine

## 2014-01-18 DIAGNOSIS — R1909 Other intra-abdominal and pelvic swelling, mass and lump: Secondary | ICD-10-CM

## 2014-08-03 ENCOUNTER — Emergency Department (HOSPITAL_COMMUNITY): Payer: 59

## 2014-08-03 ENCOUNTER — Encounter (HOSPITAL_COMMUNITY): Payer: Self-pay | Admitting: Emergency Medicine

## 2014-08-03 ENCOUNTER — Emergency Department (HOSPITAL_COMMUNITY)
Admission: EM | Admit: 2014-08-03 | Discharge: 2014-08-03 | Disposition: A | Discharge status: Home / Self Care | Payer: 59 | Attending: Emergency Medicine | Admitting: Emergency Medicine

## 2014-08-03 DIAGNOSIS — R6883 Chills (without fever): Secondary | ICD-10-CM | POA: Insufficient documentation

## 2014-08-03 DIAGNOSIS — K219 Gastro-esophageal reflux disease without esophagitis: Secondary | ICD-10-CM | POA: Diagnosis not present

## 2014-08-03 DIAGNOSIS — R05 Cough: Secondary | ICD-10-CM | POA: Insufficient documentation

## 2014-08-03 DIAGNOSIS — F909 Attention-deficit hyperactivity disorder, unspecified type: Secondary | ICD-10-CM | POA: Diagnosis not present

## 2014-08-03 DIAGNOSIS — J069 Acute upper respiratory infection, unspecified: Secondary | ICD-10-CM | POA: Diagnosis not present

## 2014-08-03 DIAGNOSIS — Z792 Long term (current) use of antibiotics: Secondary | ICD-10-CM | POA: Insufficient documentation

## 2014-08-03 DIAGNOSIS — R059 Cough, unspecified: Secondary | ICD-10-CM | POA: Insufficient documentation

## 2014-08-03 DIAGNOSIS — IMO0002 Reserved for concepts with insufficient information to code with codable children: Secondary | ICD-10-CM | POA: Insufficient documentation

## 2014-08-03 DIAGNOSIS — G8929 Other chronic pain: Secondary | ICD-10-CM | POA: Insufficient documentation

## 2014-08-03 DIAGNOSIS — Z79899 Other long term (current) drug therapy: Secondary | ICD-10-CM | POA: Diagnosis not present

## 2014-08-03 NOTE — ED Notes (Signed)
Pt reports being tx for URI at present has been on abx x 3 days not getting better.  Pt reports worsening SOB today with pain in his L chest when inhaling.

## 2014-08-03 NOTE — Discharge Instructions (Signed)
Upper Respiratory Infection, Adult An upper respiratory infection (URI) is also sometimes known as the common cold. The upper respiratory tract includes the nose, sinuses, throat, trachea, and bronchi. Bronchi are the airways leading to the lungs. Most people improve within 1 week, but symptoms can last up to 2 weeks. A residual cough may last even longer.  CAUSES Many different viruses can infect the tissues lining the upper respiratory tract. The tissues become irritated and inflamed and often become very moist. Mucus production is also common. A cold is contagious. You can easily spread the virus to others by oral contact. This includes kissing, sharing a glass, coughing, or sneezing. Touching your mouth or nose and then touching a surface, which is then touched by another person, can also spread the virus. SYMPTOMS  Symptoms typically develop 1 to 3 days after you come in contact with a cold virus. Symptoms vary from person to person. They may include:  Runny nose.  Sneezing.  Nasal congestion.  Sinus irritation.  Sore throat.  Loss of voice (laryngitis).  Cough.  Fatigue.  Muscle aches.  Loss of appetite.  Headache.  Low-grade fever. DIAGNOSIS  You might diagnose your own cold based on familiar symptoms, since most people get a cold 2 to 3 times a year. Your caregiver can confirm this based on your exam. Most importantly, your caregiver can check that your symptoms are not due to another disease such as strep throat, sinusitis, pneumonia, asthma, or epiglottitis. Blood tests, throat tests, and X-rays are not necessary to diagnose a common cold, but they may sometimes be helpful in excluding other more serious diseases. Your caregiver will decide if any further tests are required. RISKS AND COMPLICATIONS  You may be at risk for a more severe case of the common cold if you smoke cigarettes, have chronic heart disease (such as heart failure) or lung disease (such as asthma), or if  you have a weakened immune system. The very young and very old are also at risk for more serious infections. Bacterial sinusitis, middle ear infections, and bacterial pneumonia can complicate the common cold. The common cold can worsen asthma and chronic obstructive pulmonary disease (COPD). Sometimes, these complications can require emergency medical care and may be life-threatening. PREVENTION  The best way to protect against getting a cold is to practice good hygiene. Avoid oral or hand contact with people with cold symptoms. Wash your hands often if contact occurs. There is no clear evidence that vitamin C, vitamin E, echinacea, or exercise reduces the chance of developing a cold. However, it is always recommended to get plenty of rest and practice good nutrition. TREATMENT  Treatment is directed at relieving symptoms. There is no cure. Antibiotics are not effective, because the infection is caused by a virus, not by bacteria. Treatment may include:  Increased fluid intake. Sports drinks offer valuable electrolytes, sugars, and fluids.  Breathing heated mist or steam (vaporizer or shower).  Eating chicken soup or other clear broths, and maintaining good nutrition.  Getting plenty of rest.  Using gargles or lozenges for comfort.  Controlling fevers with ibuprofen or acetaminophen as directed by your caregiver.  Increasing usage of your inhaler if you have asthma. Zinc gel and zinc lozenges, taken in the first 24 hours of the common cold, can shorten the duration and lessen the severity of symptoms. Pain medicines may help with fever, muscle aches, and throat pain. A variety of non-prescription medicines are available to treat congestion and runny nose. Your caregiver   can make recommendations and may suggest nasal or lung inhalers for other symptoms.  HOME CARE INSTRUCTIONS   Only take over-the-counter or prescription medicines for pain, discomfort, or fever as directed by your  caregiver.  Use a warm mist humidifier or inhale steam from a shower to increase air moisture. This may keep secretions moist and make it easier to breathe.  Drink enough water and fluids to keep your urine clear or pale yellow.  Rest as needed.  Return to work when your temperature has returned to normal or as your caregiver advises. You may need to stay home longer to avoid infecting others. You can also use a face mask and careful hand washing to prevent spread of the virus. SEEK MEDICAL CARE IF:   After the first few days, you feel you are getting worse rather than better.  You need your caregiver's advice about medicines to control symptoms.  You develop chills, worsening shortness of breath, or brown or red sputum. These may be signs of pneumonia.  You develop yellow or brown nasal discharge or pain in the face, especially when you bend forward. These may be signs of sinusitis.  You develop a fever, swollen neck glands, pain with swallowing, or white areas in the back of your throat. These may be signs of strep throat. SEEK IMMEDIATE MEDICAL CARE IF:   You have a fever.  You develop severe or persistent headache, ear pain, sinus pain, or chest pain.  You develop wheezing, a prolonged cough, cough up blood, or have a change in your usual mucus (if you have chronic lung disease).  You develop sore muscles or a stiff neck. Document Released: 04/22/2001 Document Revised: 01/19/2012 Document Reviewed: 02/01/2014 ExitCare Patient Information 2015 [employer]. This information is not intended to replace advice given to you by your health care provider. Make sure you discuss any questions you have with your health care provider.  

## 2014-08-03 NOTE — ED Provider Notes (Signed)
CSN: [id number]     Arrival date & time 08/03/14  2126 History   First MD Initiated Contact with Patient 08/03/14 2134     Chief Complaint  Patient presents with  . Shortness of Breath  . Cough     (Consider location/radiation/quality/duration/timing/severity/associated sxs/prior Treatment) HPI 28 year old male presents with a productive cough for the past 3-4 days. He's been on Augmentin since 3 days ago feels like not getting better. He states yesterday started noticing shortness of breath. He has felt subjective chills but no documented fevers. Denies myalgias. States he thinks he had a sinus infection. Denies any leg swelling, leg pain, history of DVT, or hemoptysis. He states he has a history of anxiety and is not sure if the shortness breath is anxiety related or related to pneumonia.  Past Medical History  Diagnosis Date  . Chronic back pain   . GERD (gastroesophageal reflux disease)   . ADHD (attention deficit hyperactivity disorder)    Past Surgical History  Procedure Laterality Date  . Wisdom tooth extraction    . Mandible surgery    . Laparoscopic appendectomy N/A 04/04/2013    Procedure: APPENDECTOMY LAPAROSCOPIC;  Surgeon: Velora Heckler, MD;  Location: [facility];  Service: General;  Laterality: N/A;   No family history on file. History  Substance Use Topics  . Smoking status: Never Smoker   . Smokeless tobacco: Never Used  . Alcohol Use: Yes     Comment: rare    Review of Systems  Constitutional: Positive for chills. Negative for fever.  HENT: Positive for congestion. Negative for sore throat and trouble swallowing.   Respiratory: Positive for cough and shortness of breath.   Cardiovascular: Negative for leg swelling.  Gastrointestinal: Negative for vomiting.  All other systems reviewed and are negative.     Allergies  Ceclor  Home Medications   Prior to Admission medications   Medication Sig Start Date End Date Taking? Authorizing Provider  ALPRAZolam  Prudy Feeler) 0.5 MG tablet Take 0.25-0.5 mg by mouth at bedtime as needed for anxiety.   Yes Historical Provider, MD  amoxicillin-clavulanate (AUGMENTIN) 875-125 MG per tablet Take 1 tablet by mouth 2 (two) times daily. For 7 days 08/01/14  Yes Historical Provider, MD  amphetamine-dextroamphetamine (ADDERALL XR) 15 MG 24 hr capsule Take 15 mg by mouth daily.   Yes Historical Provider, MD  amphetamine-dextroamphetamine (ADDERALL XR) 20 MG 24 hr capsule Take 20 mg by mouth daily with breakfast.   Yes Historical Provider, MD  dextromethorphan-guaiFENesin (MUCINEX DM) 30-600 MG per 12 hr tablet Take 1 tablet by mouth 2 (two) times daily as needed for cough.   Yes Historical Provider, MD  fluticasone (FLONASE) 50 MCG/ACT nasal spray Place 2 sprays into the nose daily.   Yes Historical Provider, MD  Influenza Vac Split Quad (FLUZONE) 0.25 ML injection Inject 0.25 mLs into the muscle once.   Yes Historical Provider, MD  loratadine (CLARITIN) 10 MG tablet Take 10 mg by mouth daily.   Yes Historical Provider, MD  naproxen sodium (ANAPROX) 220 MG tablet Take 440 mg by mouth 2 (two) times daily with a meal.   Yes Historical Provider, MD  omeprazole (PRILOSEC) 20 MG capsule Take 20 mg by mouth daily.   Yes Historical Provider, MD  traMADol (ULTRAM) 50 MG tablet Take 50 mg by mouth 2 (two) times daily as needed (for jawpain).    Yes Historical Provider, MD   BP 114/78  Pulse 106  Temp(Src) 98.6 F (37 C) (Oral)  Resp 20  SpO2 100% Physical Exam  Nursing note and vitals reviewed. Constitutional: He is oriented to person, place, and time. He appears well-developed and well-nourished. No distress.  HENT:  Head: Normocephalic and atraumatic.  Right Ear: External ear normal.  Left Ear: External ear normal.  Nose: Nose normal.  Eyes: Right eye exhibits no discharge. Left eye exhibits no discharge.  Neck: Neck supple.  Cardiovascular: Normal rate, regular rhythm, normal heart sounds and intact distal pulses.    Pulmonary/Chest: Effort normal and breath sounds normal. He has no wheezes. He has no rales.  Speaks in complete sentences. No increased work of breathing or tachypnea.  Abdominal: Soft. There is no tenderness.  Musculoskeletal: He exhibits no edema.  Neurological: He is alert and oriented to person, place, and time.  Skin: Skin is warm and dry.    ED Course  Procedures (including critical care time) Labs Review Labs Reviewed - No data to display  Imaging Review Dg Chest 2 View  08/03/2014   CLINICAL DATA:  Shortness of breath, cough  EXAM: CHEST  2 VIEW  COMPARISON:  None.  FINDINGS: The cardiac and mediastinal silhouettes are within normal limits.  The lungs are normally inflated. No airspace consolidation, pleural effusion, or pulmonary edema is identified. There is no pneumothorax.  No acute osseous abnormality identified.  IMPRESSION: No active cardiopulmonary disease.   Electronically Signed   By: Rise Mu M.D.   On: 08/03/2014 21:58     EKG Interpretation None      MDM   Final diagnoses:  Upper respiratory infection    Patient symptoms are consistent with an upper respiratory infection. There is no evidence of pneumonia. Patient has no increased work of breathing and no hypoxia. Given his productive cough and chills this seems to be infectious process and I have very low suspicion this is something else such as ACS or pulmonary embolism. Will treat with symptomatic care at this time and have him followup with his PCP. Discussed strict return precautions.    Audree Camel, MD 08/03/14 2236

## 2015-11-09 IMAGING — US US EXTREM LOW VENOUS*L*
1 series · 14 of 24 positions shown · non-contrast
Comparison: None.

CLINICAL DATA: Left lower extremity pain and tingling. Rule out
DVT.

EXAM:
Left LOWER EXTREMITY VENOUS DOPPLER ULTRASOUND
TECHNIQUE: Gray-scale sonography with graded compression, as well as color
Doppler and duplex ultrasound, were performed to evaluate the deep
venous system from the level of the common femoral vein through the
popliteal and proximal calf veins. Spectral Doppler was utilized to
evaluate flow at rest and with distal augmentation maneuvers.

[Series 1: us extrem low venous*left* · 14 of 29 slices shown]
[im 1/29]
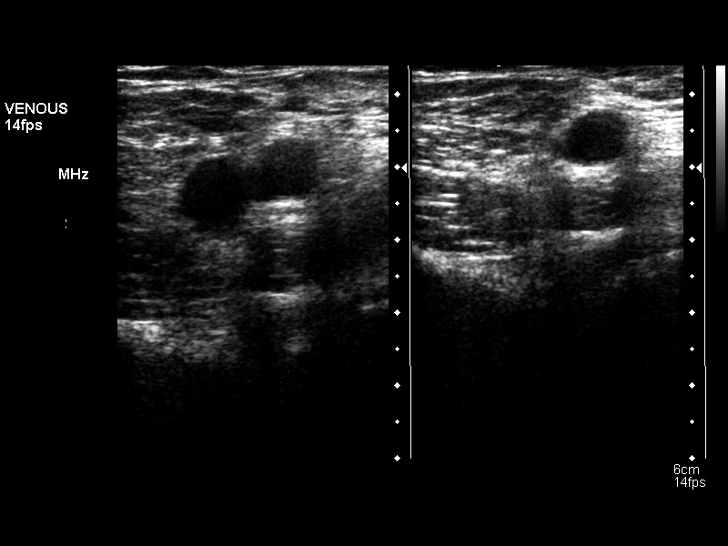
[im 3/29]
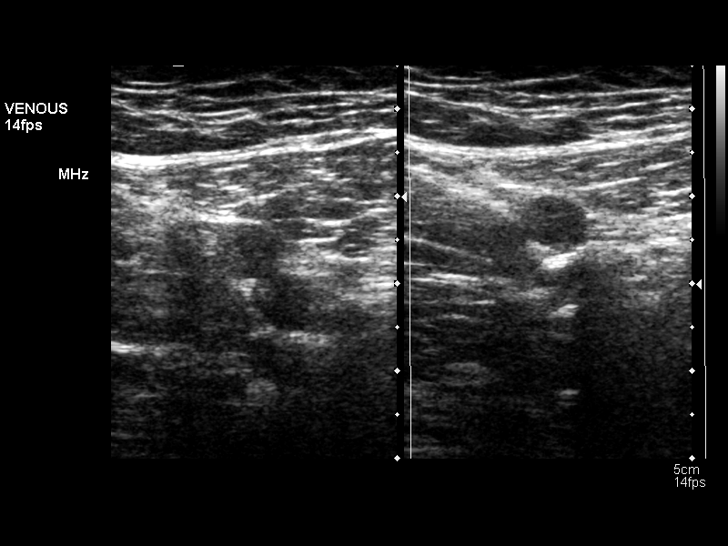
[im 5/29]
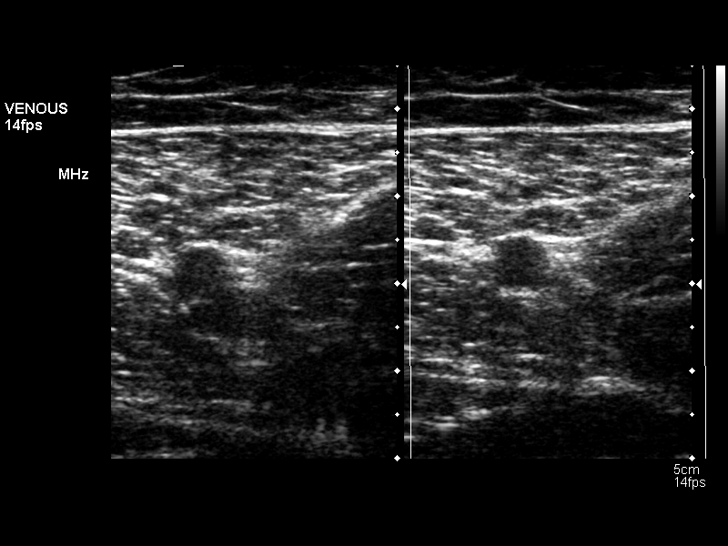
[im 8/29]
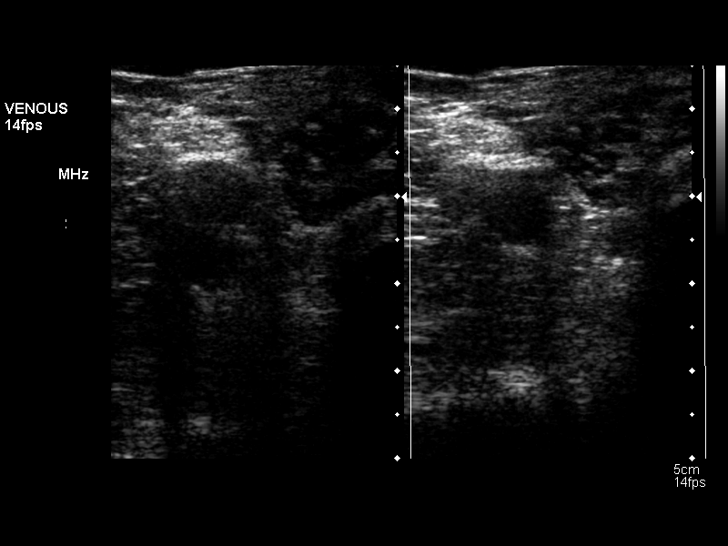
[im 9/29]
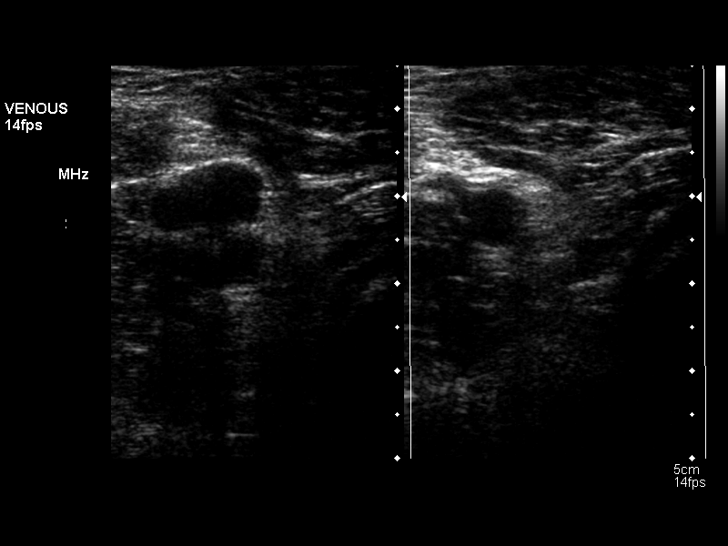
[im 11/29]
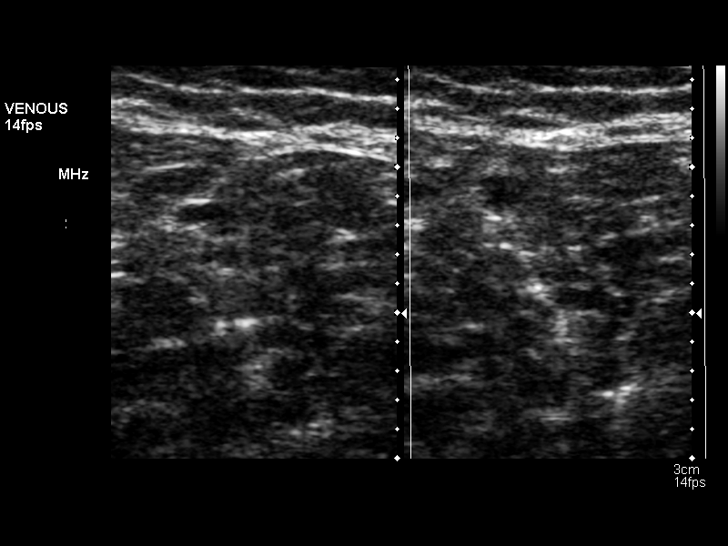
[im 14/29]
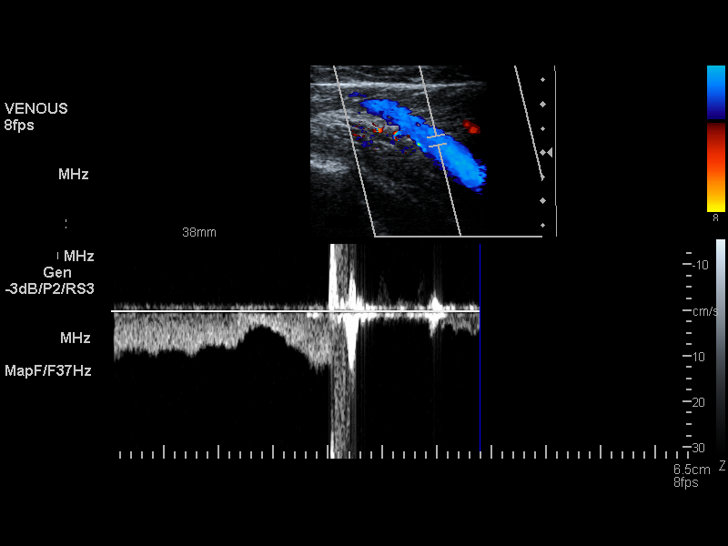
[im 15/29]
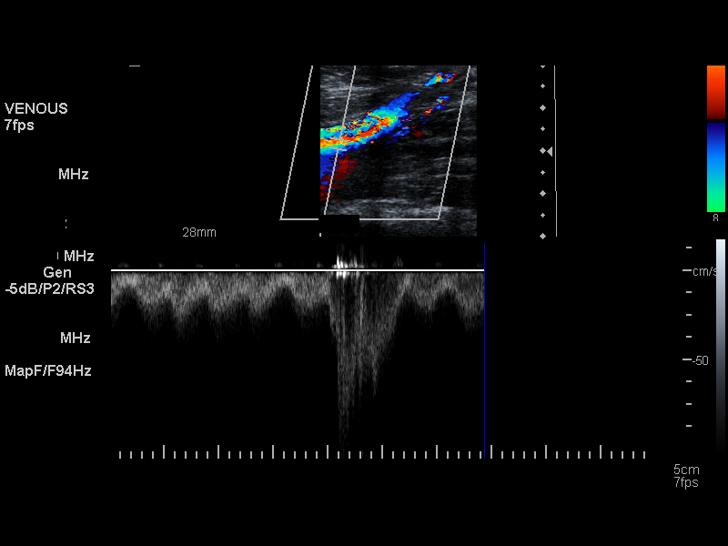
[im 18/29]
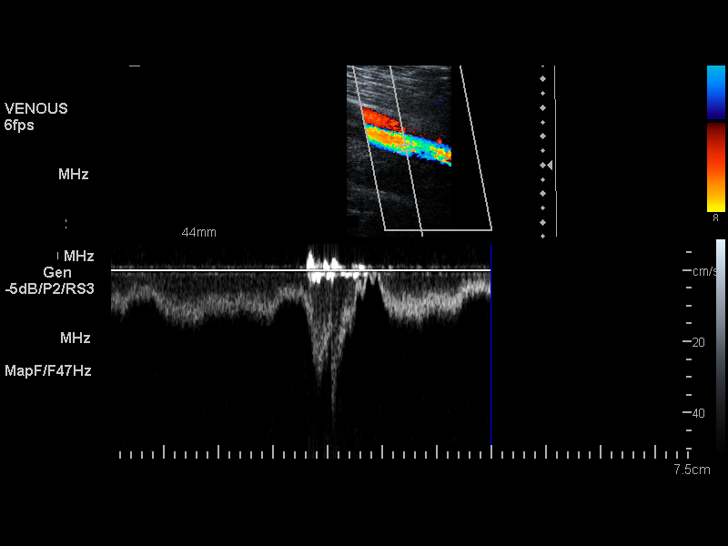
[im 20/29]
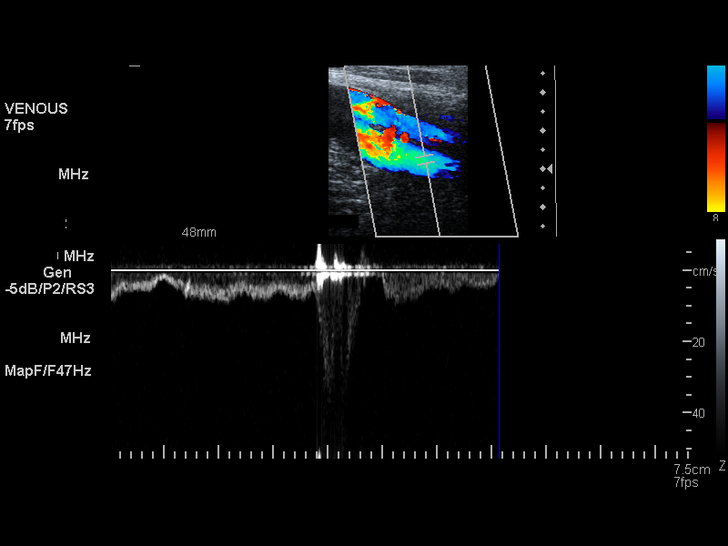
[im 22/29]
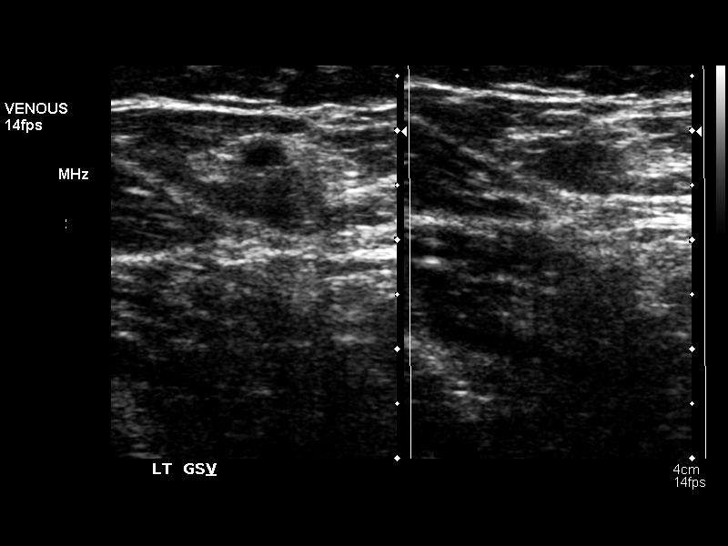
[im 24/29]
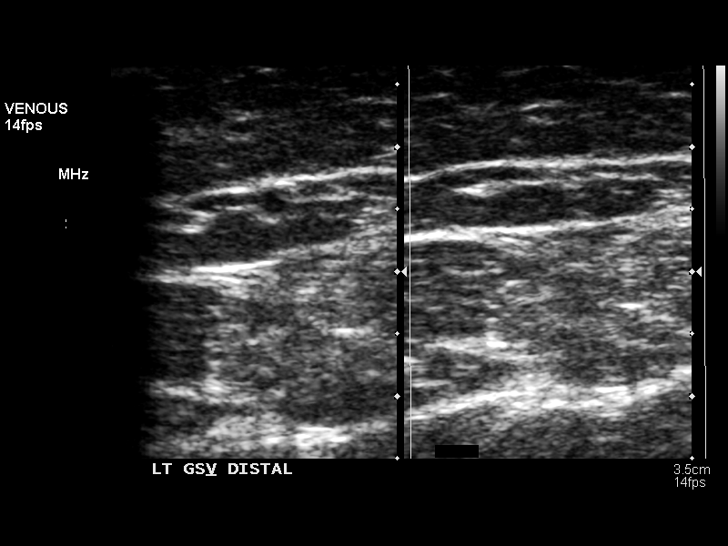
[im 26/29]
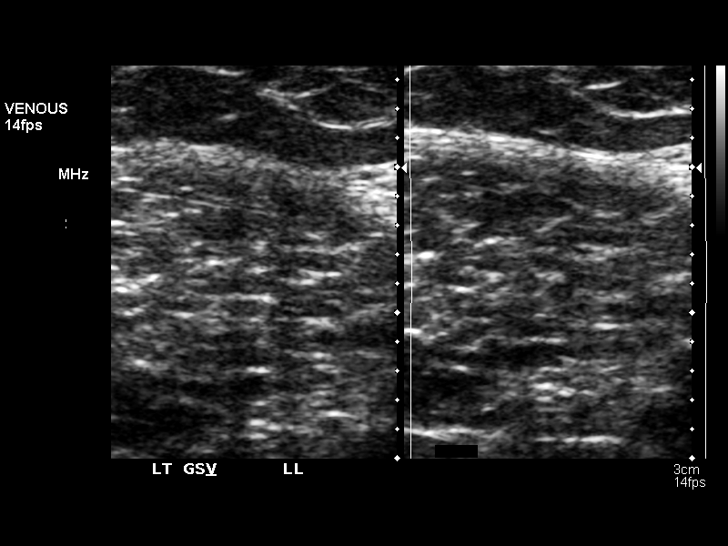
[im 29/29]
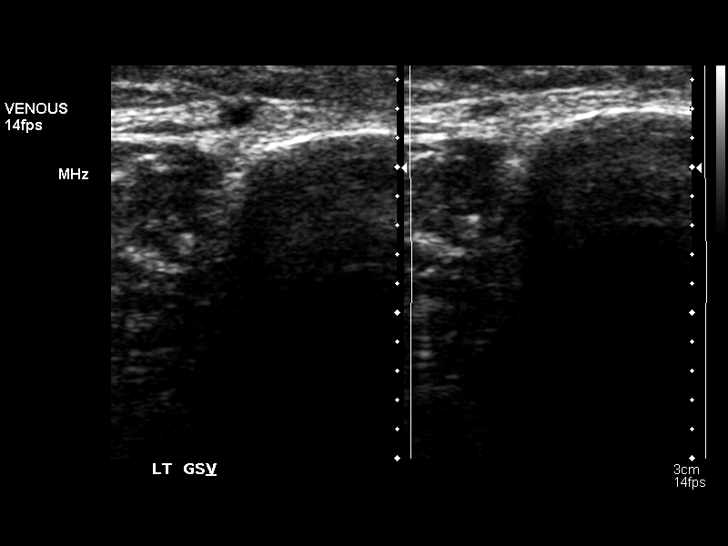

[14 of 24 positions shown; findings below may reference images not displayed]

FINDINGS: Thrombus within deep veins:  None visualized.

Compressibility of deep veins:  Normal.

Duplex waveform respiratory phasicity:  Normal.

Duplex waveform response to augmentation:  Normal.

Venous reflux:  None visualized.

Other findings: Greater saphenous vein compresses normally.
Visualized small veins below the knee are unremarkable.
IMPRESSION: No evidence of venous thrombosis within the left lower extremity.

## 2015-11-11 IMAGING — US US PELVIS LIMITED
1 series · 8 of 8 positions shown · non-contrast
Comparison: none

[Series 1: us pelvis limited · 0.03mm/px · 8 of 8 slices shown]
[im 1/8]
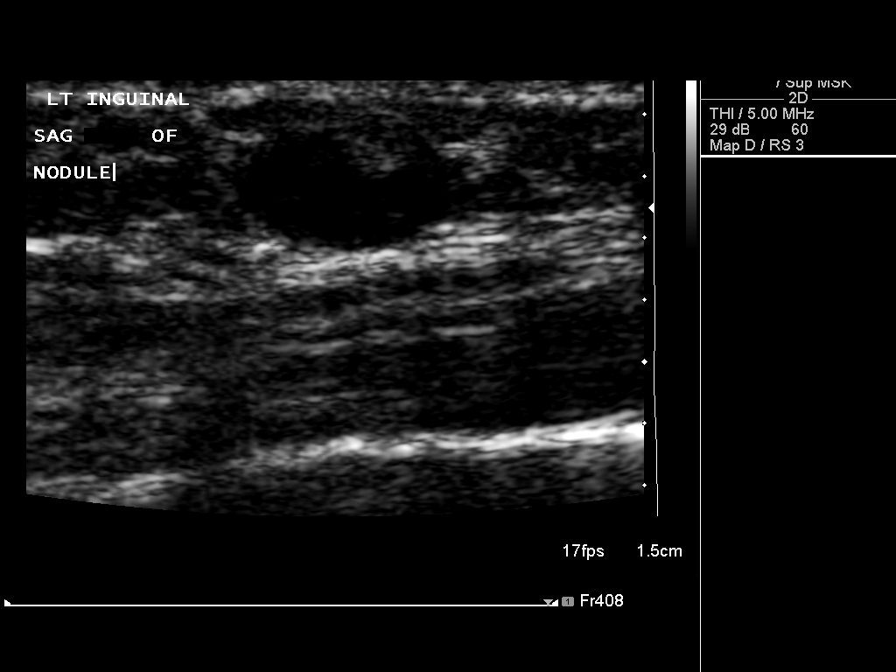
[im 2/8]
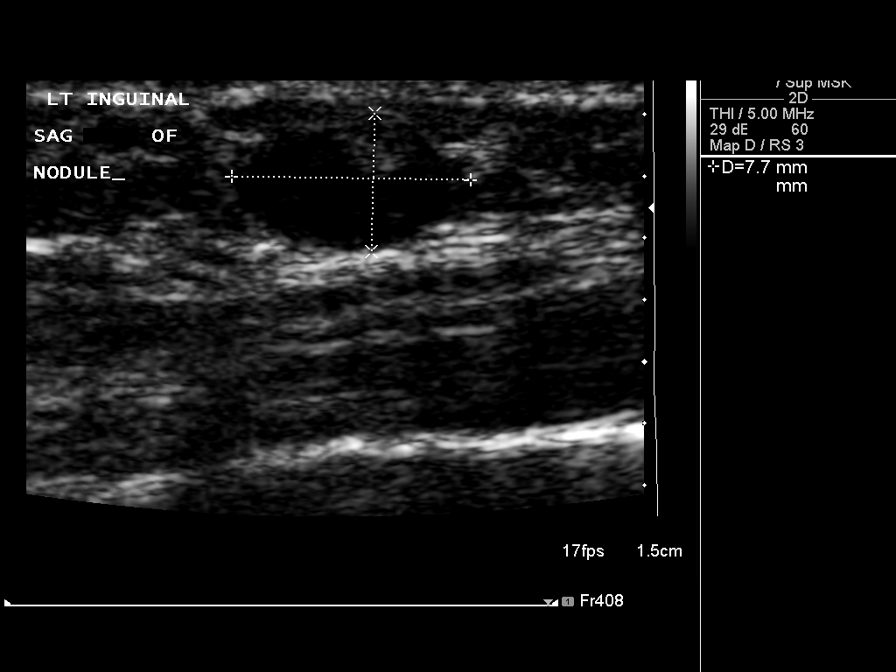
[im 3/8]
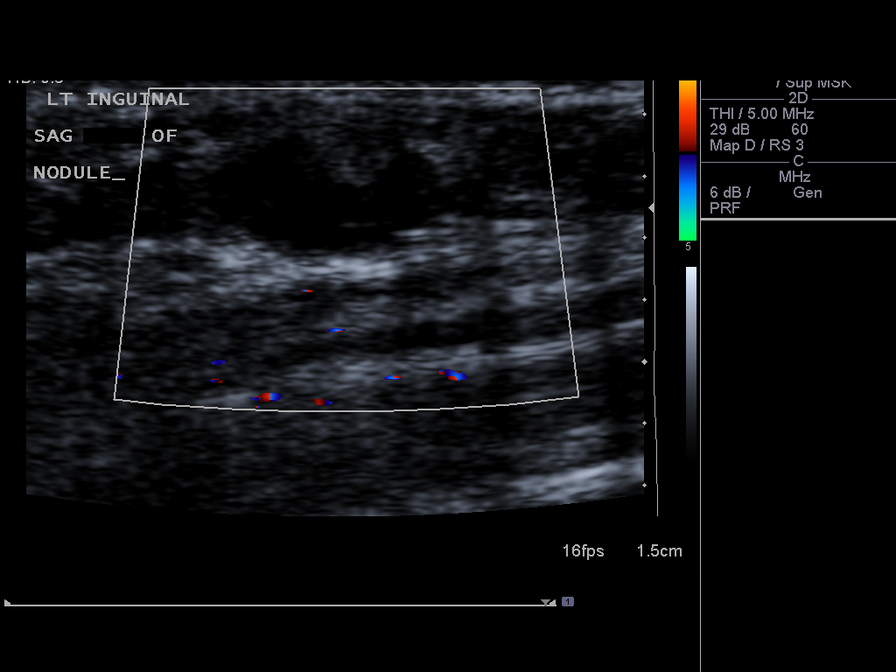
[im 4/8]
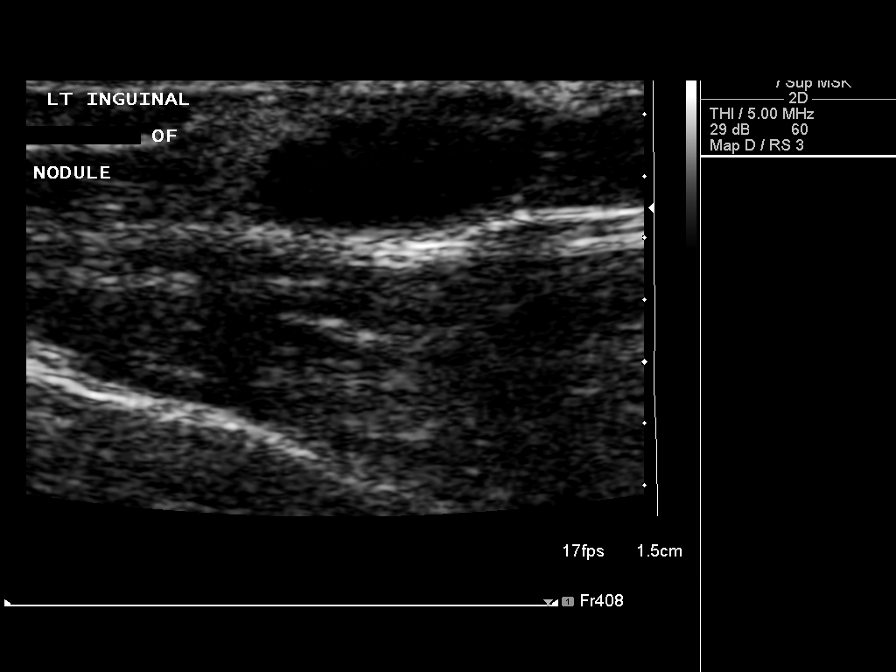
[im 5/8]
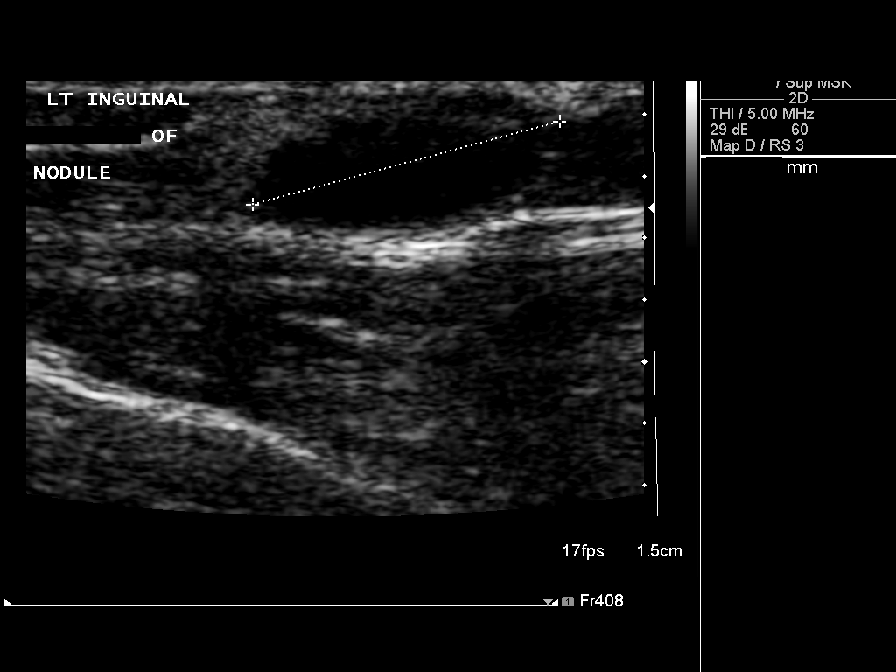
[im 6/8]
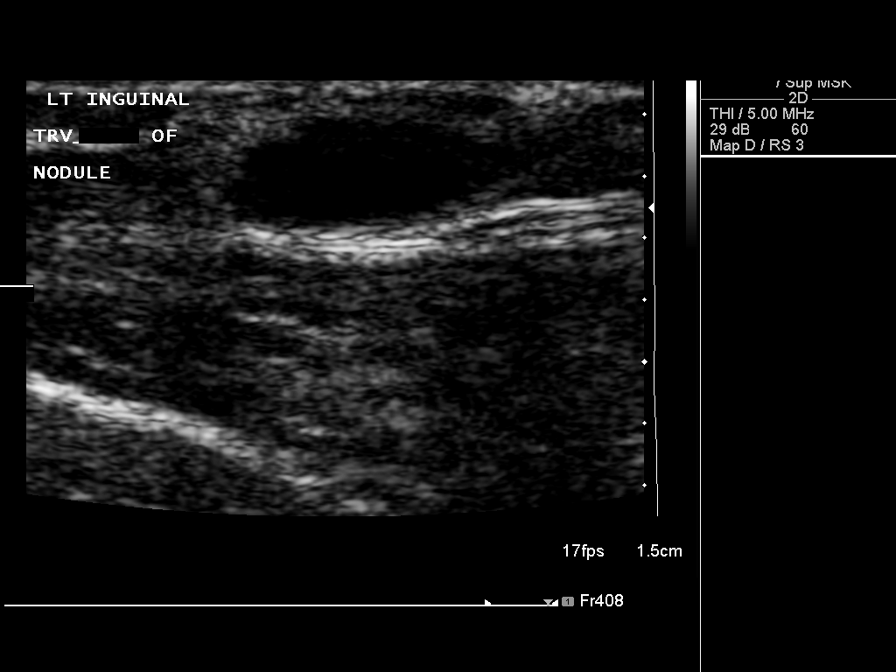
[im 7/8]
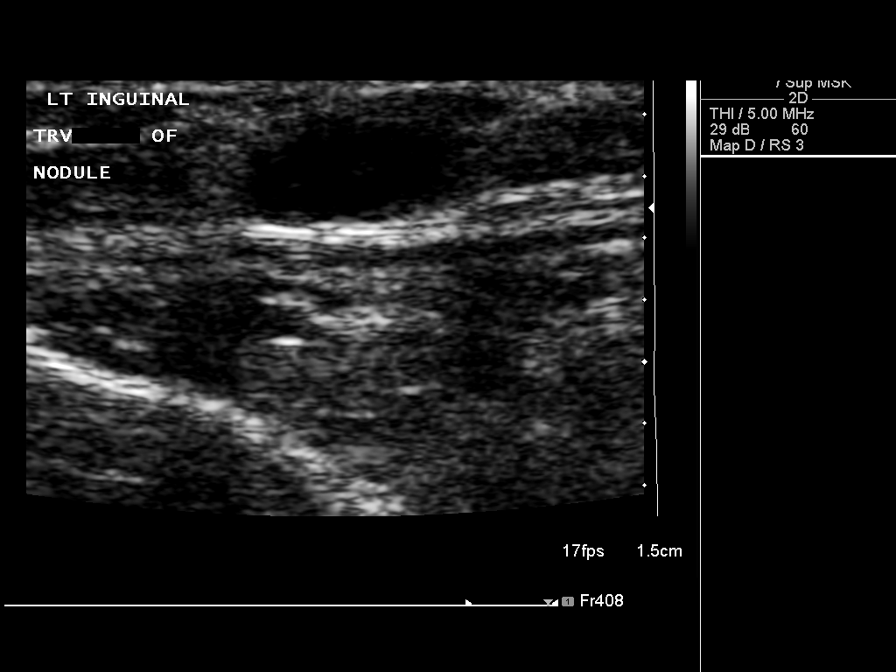
[im 8/8]
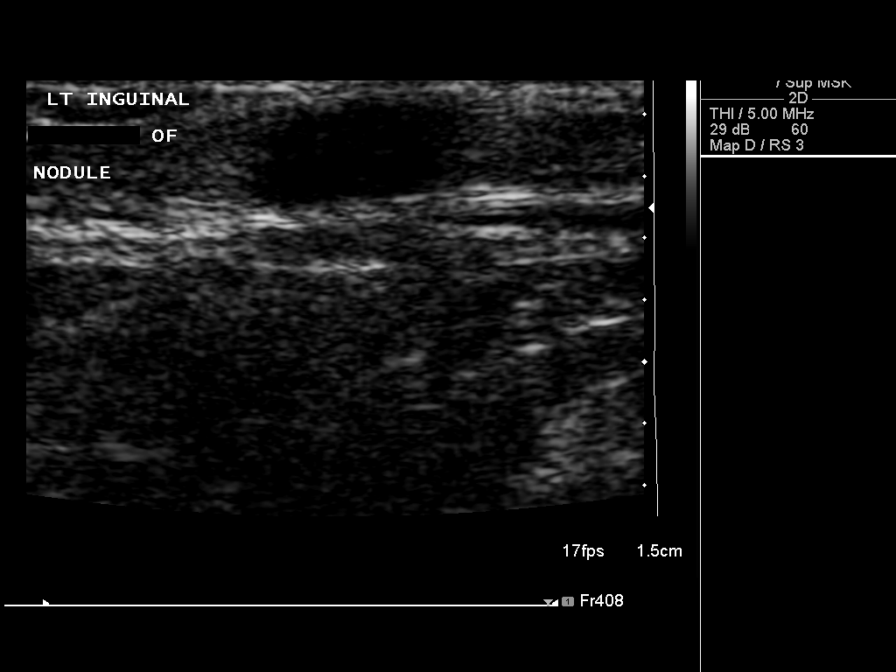

[8 of 8 positions shown; findings below may reference images not displayed]

CLINICAL DATA
Palpable area in the left inguinal region

EXAM
US PELVIS LIMITED

TECHNIQUE
Ultrasound examination of the pelvic soft tissues was performed in
the area of clinical concern.

COMPARISON
CT abdomen pelvis of 04/04/2013

FINDINGS
At the site in question within the left inguinal region, there is an
oval complex soft tissue structure of 0.8 x 0.5 x 1.0 cm. No blood
flow is seen within this relatively hypoechoic structure. This does
not have the typical appearance of a lymph node. No motion is
present to suggest hernia. Clinical followup is recommended.

IMPRESSION
Hypoechoic complex structure corresponds to the palpable abnormality
in the left inguinal region as noted above without internal blood
flow. Recommend clinical followup. This does not represent a typical
lymph node.

SIGNATURE

## 2020-05-29 DIAGNOSIS — M47818 Spondylosis without myelopathy or radiculopathy, sacral and sacrococcygeal region: Secondary | ICD-10-CM | POA: Insufficient documentation

## 2020-08-17 DIAGNOSIS — F411 Generalized anxiety disorder: Secondary | ICD-10-CM | POA: Insufficient documentation

## 2020-08-17 DIAGNOSIS — F5101 Primary insomnia: Secondary | ICD-10-CM | POA: Insufficient documentation

## 2020-12-31 DIAGNOSIS — F330 Major depressive disorder, recurrent, mild: Secondary | ICD-10-CM | POA: Insufficient documentation

## 2020-12-31 DIAGNOSIS — F902 Attention-deficit hyperactivity disorder, combined type: Secondary | ICD-10-CM | POA: Insufficient documentation

## 2021-03-06 ENCOUNTER — Ambulatory Visit: Payer: BC Managed Care – PPO | Admitting: Neurology

## 2021-03-07 ENCOUNTER — Ambulatory Visit: Payer: BC Managed Care – PPO | Admitting: Neurology

## 2021-03-13 ENCOUNTER — Ambulatory Visit: Payer: BC Managed Care – PPO | Admitting: Neurology

## 2021-03-13 ENCOUNTER — Encounter: Payer: Self-pay | Admitting: Neurology

## 2021-03-13 VITALS — BP 122/78 | HR 89 | Ht 72.0 in | Wt 180.5 lb

## 2021-03-13 DIAGNOSIS — M5412 Radiculopathy, cervical region: Secondary | ICD-10-CM

## 2021-03-13 DIAGNOSIS — F419 Anxiety disorder, unspecified: Secondary | ICD-10-CM | POA: Diagnosis not present

## 2021-03-13 DIAGNOSIS — R200 Anesthesia of skin: Secondary | ICD-10-CM

## 2021-03-13 DIAGNOSIS — M5020 Other cervical disc displacement, unspecified cervical region: Secondary | ICD-10-CM

## 2021-03-13 DIAGNOSIS — M549 Dorsalgia, unspecified: Secondary | ICD-10-CM | POA: Diagnosis not present

## 2021-03-13 DIAGNOSIS — G8929 Other chronic pain: Secondary | ICD-10-CM | POA: Insufficient documentation

## 2021-03-13 NOTE — Progress Notes (Signed)
[facility]  PATIENT: Gregg King DOB: [birth year]  REFERRING DOCTOR OR PCP:  Dr. Melvyn Novas (Ortho); Roslynn Amble (PCP); Dr. Valera Castle (psych) SOURCE: Patient, notes from orthopedics, imaging reports, multiple MRI and CT images personally reviewed.  _________________________________   HISTORICAL  CHIEF COMPLAINT:  Chief Complaint  Patient presents with  . New Patient (Initial Visit)    RM 13,alone. Paper referral from [facility] for pain in left elbow. Bone cracks against the other and slides. Having a lot of pain. Loss of sensation from shoulder to pinky. Brought imaging on CD for MD to review.    HISTORY OF PRESENT ILLNESS:  I had the pleasure of seeing your patient, Gregg King, at [facility] for neurologic consultation regarding his left arm numbness and weakness.  He is a 35 year old man who had the onset of left arm numbness about 2-3 years ago.  About the same time, he started to have tremors in his left arm, often worse with pain and anxiety.     Compared to a year ago, he feels a little better.   He currently has numbness from the armpit on down to all his fingers.   He notes a little weakness in arm muscles, in a nonspecific manner.  Since that time, he has had multiple imaging studies of the brain and spine.  These have been personally reviewed and summarized below.  Most importantly, he has disc protrusions to the left at C3-C4 and C4-C5 and a disc herniation to the left at C5-C6.  These can affect the left C4, C5 and C6 nerve roots.  The spinal cord has normal signal.  In December 2018, he laid on a sewing needle that entered the left shoulder area.   He went to the ED and was told nothing vital was hit.   The needle migrated and he had surgery to remove it 2 weeks later.   He was told that it did not pass to anything vital.  Additionally, he had a few traumas over the past few years.   In summer 2020 he had a fall in the bathroom -  hitting his side not the head.   He hit his head on a staircase several times.  One time 01/29/2019, he hit his head hard and had vertigo but no LOC.     He reports back and leg pain also.  He notes his walking worse due to increased back pain.  Legs hurt more at night.  He is taking Naproxen and prn vicodin (just at night if wakes up in pain) .   He deines difficulty with his bladder/bowel.    He has anxiety, better with Buspar.    When he has more pain, his anxiety increases.   He has had some tremoring in the left arm.  This can happen randomly but is more likely to happen when he has more pain and sometimes more anxiety.  He is doing PT at [facility].    Imaging scans personally reviewed: CT scan brain and cervical spine 02/17/20.  The brain was normal.  Cervical spine showed no bony pathology.  However, disc protrusions to the left were noted at C3-C4, C4-C5 and C5-C6.  MRI of the brain and cervical spine 03/04/2020 showed disc protrusions at C3-C4, C4-C5 to the left and disc herniation to the left at C5-C6.  There was potential for left C6 nerve root compression and to a lesser extent involvement of the C4-C5 nerve roots.  Additionally, there was  stenosis of the spinal canal on the left, especially at C5-C6.  The spinal cord has normal signal.  The brain was essentially normal.  MRI of the lumbar and thoracic spine 03/27/2020 shows a disc protrusion at L3-L4 but no nerve root compression.  REVIEW OF SYSTEMS: Constitutional: No fevers, chills, sweats, or change in appetite Eyes: No visual changes, double vision, eye pain Ear, nose and throat: No hearing loss, ear pain, nasal congestion, sore throat Cardiovascular: No chest pain, palpitations Respiratory: No shortness of breath at rest or with exertion.   No wheezes GastrointestinaI: No nausea, vomiting, diarrhea, abdominal pain, fecal incontinence Genitourinary: No dysuria, urinary retention or frequency.  No  nocturia. Musculoskeletal:Neck pain, back pain Integumentary: No rash, pruritus, skin lesions Neurological: as above Psychiatric: Has anxiety greater than depression. Endocrine: No palpitations, diaphoresis, change in appetite, change in weigh or increased thirst Hematologic/Lymphatic: No anemia, purpura, petechiae. Allergic/Immunologic: No itchy/runny eyes, nasal congestion, recent allergic reactions, rashes  ALLERGIES: Allergies  Allergen Reactions  . Ceclor [Cefaclor] Nausea And Vomiting    HOME MEDICATIONS:  Current Outpatient Medications:  .  busPIRone (BUSPAR) 15 MG tablet, Take 15 mg by mouth 2 (two) times daily., Disp: , Rfl:  .  DULoxetine (CYMBALTA) 60 MG capsule, Take 2 capsules by mouth daily., Disp: , Rfl:  .  HYDROcodone-acetaminophen (NORCO/VICODIN) 5-325 MG tablet, Take 1 tablet by mouth 2 (two) times daily as needed., Disp: , Rfl:  .  loratadine (CLARITIN) 10 MG tablet, Take 10 mg by mouth daily., Disp: , Rfl:  .  naproxen sodium (ANAPROX) 220 MG tablet, Take 440 mg by mouth 2 (two) times daily with a meal., Disp: , Rfl:  .  omeprazole (PRILOSEC) 20 MG capsule, Take 20 mg by mouth daily., Disp: , Rfl:   PAST MEDICAL HISTORY: Past Medical History:  Diagnosis Date  . ADHD (attention deficit hyperactivity disorder)   . Chronic back pain   . GERD (gastroesophageal reflux disease)     PAST SURGICAL HISTORY: Past Surgical History:  Procedure Laterality Date  . LAPAROSCOPIC APPENDECTOMY N/A 04/04/2013   Procedure: APPENDECTOMY LAPAROSCOPIC;  Surgeon: Velora Hecklerodd M Gerkin, MD;  Location: [facility];  Service: General;  Laterality: N/A;  . MANDIBLE SURGERY    . WISDOM TOOTH EXTRACTION      FAMILY HISTORY: Family History  Problem Relation Age of Onset  . Arthritis Paternal Aunt     SOCIAL HISTORY:  Social History   Socioeconomic History  . Marital status: Married    Spouse name: Hidden MeadowsHolly  . Number of children: 2  . Years of education: 3519  . Highest education level:  Not on file  Occupational History  . Occupation: On disability  Tobacco Use  . Smoking status: Never Smoker  . Smokeless tobacco: Never Used  Substance and Sexual Activity  . Alcohol use: Yes    Comment: ocass  . Drug use: Yes    Types: Marijuana    Comment: Uses for pain  . Sexual activity: Never  Other Topics Concern  . Not on file  Social History Narrative   Lives with wife and children   Caffeine use: 200mg  per day    Right handed   Social Determinants of Health   Financial Resource Strain: Not on file  Food Insecurity: Not on file  Transportation Needs: Not on file  Physical Activity: Not on file  Stress: Not on file  Social Connections: Not on file  Intimate Partner Violence: Not on file     PHYSICAL EXAM  Vitals:   03/13/21 1007  BP: 122/78  Pulse: 89  SpO2: 97%  Weight: 180 lb 8 oz (81.9 kg)  Height: 6' (1.829 m)    Body mass index is 24.48 kg/m.   General: The patient is well-developed and well-nourished and in no acute distress  HEENT:  Head is Brooks/AT.  Sclera are anicteric.   Neck: No carotid bruits are noted.  The neck is nontender.  Cardiovascular: The heart has a regular rate and rhythm with a normal S1 and S2. There were no murmurs, gallops or rubs.    Skin: Extremities are without rash or  edema.  Musculoskeletal:  Back is nontender  Neurologic Exam  Mental status: The patient is alert and oriented x 3 at the time of the examination. The patient has apparent normal recent and remote memory, with an apparently normal attention span and concentration ability.   Speech is normal.  Cranial nerves: Extraocular movements are full.   Facial symmetry is present. There is good facial sensation to soft touch bilaterally.Facial strength is normal.  Trapezius and sternocleidomastoid strength is normal. No dysarthria is noted.  The tongue is midline, and the patient has symmetric elevation of the soft palate. No obvious hearing deficits are  noted.  Motor: A few times during the history and physical he had a tremor in the left arm.  It was not regular and seem to be worse when we discussed the symptoms of the left arm.  Muscle bulk is normal.   Tone is normal. Strength was difficult to assess due to some giveaway but there was possible weakness in the left deltoid and biceps.  Strength was 5/5 elsewhere.  Sensory: Sensory testing was normal in the right arm.  On the left he reported reduced sensation, more over the hyperthenar eminence than the thenar eminence.  Coordination: Cerebellar testing reveals good finger-nose-finger and heel-to-shin bilaterally.  Gait and station: Station is normal.   Gait was antalgic.  Tandem was wide.. Romberg is negative.   Reflexes: Deep tendon reflexes are symmetric and normal bilaterally.   Plantar responses are flexor.    DIAGNOSTIC DATA (LABS, IMAGING, TESTING) - I reviewed patient records, labs, notes, testing and imaging myself where available.  Lab Results  Component Value Date   WBC 12.7 (H) 04/04/2013   HGB 15.3 04/04/2013   HCT 45.0 04/04/2013   MCV 87.4 04/04/2013   PLT 198 04/04/2013      Component Value Date/Time   NA 142 04/04/2013 0441   K 3.9 04/04/2013 0441   CL 104 04/04/2013 0441   GLUCOSE 90 04/04/2013 0441   BUN 10 04/04/2013 0441   CREATININE 0.90 04/04/2013 0441   PROT 7.2 04/04/2013 0420   ALBUMIN 4.0 04/04/2013 0420   AST 21 04/04/2013 0420   ALT 15 04/04/2013 0420   ALKPHOS 72 04/04/2013 0420   BILITOT 0.4 04/04/2013 0420      ASSESSMENT AND PLAN  Left arm numbness - Plan: NCV with EMG(electromyography)  Left cervical radiculopathy - Plan: NCV with EMG(electromyography)  Anxiety  Chronic back pain, unspecified back location, unspecified back pain laterality  Cervical herniated disc    In summary, Mr. Geier is a 35 year old man with left arm numbness and weakness, likely due to the disc protrusions at C3-C4 through C5-C6.  Examination was  difficult due to the anxiety and reported discomfort.  The numbness in the left arm did not match the MRI.  To better determine the role and extent of the disc protrusions I  will check a NCV/EMG.  This will also allow [place] to assess for median and ulnar neuropathies.  I do not believe he has a movement disorder.  The tremor occurs mostly when we were discussing the numbness in the arm and he has noted that it is associated with stress.  He is advised to continue daily medications for anxiety.  I will see him in a couple weeks when he returns for the NCV/EMG study and further recommendations may be made based on those results.  If he has a major change in symptoms between now and the EMG he will give [place] a call.  Thank you for asking me to see Mr. Province.  Please let me know if I can be of further assistance with him or other patients in the future.  Robbie Nangle A. Epimenio Foot, MD, [facility] 03/13/2021, 1:03 PM Certified in Neurology, Clinical Neurophysiology, Sleep Medicine and Neuroimaging  [facility] [address] [place], [place] [address] [phone]

## 2021-03-26 ENCOUNTER — Encounter: Payer: BC Managed Care – PPO | Admitting: Neurology

## 2021-05-21 ENCOUNTER — Other Ambulatory Visit: Payer: Self-pay

## 2021-05-21 ENCOUNTER — Encounter: Payer: BC Managed Care – PPO | Admitting: Neurology

## 2021-05-21 ENCOUNTER — Encounter: Payer: Self-pay | Admitting: Neurology

## 2021-05-21 ENCOUNTER — Ambulatory Visit (INDEPENDENT_AMBULATORY_CARE_PROVIDER_SITE_OTHER): Payer: BC Managed Care – PPO | Admitting: Neurology

## 2021-05-21 DIAGNOSIS — M5412 Radiculopathy, cervical region: Secondary | ICD-10-CM

## 2021-05-21 DIAGNOSIS — Z0289 Encounter for other administrative examinations: Secondary | ICD-10-CM

## 2021-05-21 DIAGNOSIS — R200 Anesthesia of skin: Secondary | ICD-10-CM

## 2021-05-21 MED ORDER — GABAPENTIN 300 MG PO CAPS: 300.0000 mg | ORAL_CAPSULE | Freq: Three times a day (TID) | ORAL | 3 refills | Status: DC

## 2021-05-21 NOTE — Progress Notes (Signed)
Full Name: Gregg King Gender: Male MRN #: [id number] Date of Birth: [birth year]    Visit Date: 05/21/2021 09:17 Age: 35 Years Examining Physician: Despina Arias, MD  Referring Physician: Despina Arias, MD Height: 6 feet 0 inch Patient History: 180lbs    History: Mr. Gregg King is a 36 year old man with bilateral arm pain and numbness.  He reports numbness from the left armpit to all of the fingers.  He also feels there is mild left arm weakness.  There are milder symptoms on the right.  There was mild weakness in the left deltoid and biceps.  Nerve conduction studies: Bilateral median and ulnar motor responses showed normal distal latencies, amplitudes and conduction velocities.  Bilateral median and ulnar sensory responses showed normal peak latencies and amplitudes.  Electromyography: Needle EMG of selected muscles of both arms was performed.  In the right arm, there was mild chronic denervation in the right triceps and flexor pollicis longus.  The other muscles tested in right arm were normal.  In the left arm, there was mild chronic elevation in the deltoid, triceps, and EDC muscle and some polyphasic motor units in the flexor pollicis longus muscle.  Other muscles tested were normal.   Impression: This NCV/EMG study shows the following: Mild chronic left C6 and C7 radiculopathies and mild chronic right C7 radiculopathy.  There were no active features. No evidence of median or ulnar mononeuropathies.   Gregg King A. Gregg Foot, MD, PhD, FAAN Certified in Neurology, Clinical Neurophysiology, Sleep Medicine, Pain Medicine and Neuroimaging Director, [facility] at [facility]  [facility] [address], [address] [place], [place] [address] [phone]  Clinical note: Due to continuing symptoms, sending a prescription for gabapentin to help with the dysesthesias.  Additionally we will check an MRI of the cervical spine to further  evaluate the C6 and C7 radiculopathy.  -- RAS  Verbal informed consent was obtained from the patient, patient was informed of potential risk of procedure, including bruising, bleeding, hematoma formation, infection, muscle weakness, muscle pain, numbness, among others.        MNC    Nerve / Sites Muscle Latency Ref. Amplitude Ref. Rel Amp Segments Distance Velocity Ref. Area    ms ms mV mV %  cm m/s m/s mVms  L Median - APB     Wrist APB 3.8 ?4.4 5.3 ?4.0 100 Wrist - APB 7   15.3     Upper arm APB 8.3  5.3  99.1 Upper arm - Wrist 24 53 ?49 15.9  R Median - APB     Wrist APB 3.1 ?4.4 6.8 ?4.0 100 Wrist - APB 7   18.8     Upper arm APB 7.9  5.8  85.2 Upper arm - Wrist 25 52 ?49 20.8  L Ulnar - ADM     Wrist ADM 2.8 ?3.3 9.7 ?6.0 100 Wrist - ADM 7   37.1     B.Elbow ADM 6.5  9.4  96.4 B.Elbow - Wrist 21 57 ?49 31.9     A.Elbow ADM 8.3  9.1  96.4 A.Elbow - B.Elbow 10 56 ?49 31.1  R Ulnar - ADM     Wrist ADM 2.8 ?3.3 8.8 ?6.0 100 Wrist - ADM 7   28.7     B.Elbow ADM 6.5  7.8  88.2 B.Elbow - Wrist 22 59 ?49 23.5     A.Elbow ADM 8.3  7.9  102 A.Elbow - B.Elbow 10 58 ?49 24.9  SNC    Nerve / Sites Rec. Site Peak Lat Ref.  Amp Ref. Segments Distance Peak Diff Ref.    ms ms V V  cm ms ms  L Median, Ulnar - Transcarpal comparison     Median Palm Wrist 2.0 ?2.2 50 ?35 Median Palm - Wrist 8       Ulnar Palm Wrist 2.1 ?2.2 15 ?12 Ulnar Palm - Wrist 8          Median Palm - Ulnar Palm  -0.1 ?0.4  R Median, Ulnar - Transcarpal comparison     Median Palm Wrist 1.9 ?2.2 78 ?35 Median Palm - Wrist 8       Ulnar Palm Wrist 2.0 ?2.2 19 ?12 Ulnar Palm - Wrist 8          Median Palm - Ulnar Palm  -0.1 ?0.4  L Median - Orthodromic (Dig II, Mid palm)     Dig II Wrist 2.9 ?3.4 11 ?10 Dig II - Wrist 13    R Median - Orthodromic (Dig II, Mid palm)     Dig II Wrist 2.9 ?3.4 15 ?10 Dig II - Wrist 13    L Ulnar - Orthodromic, (Dig V, Mid palm)     Dig V Wrist 2.8 ?3.1 7 ?5 Dig V - Wrist 11     R Ulnar - Orthodromic, (Dig V, Mid palm)     Dig V Wrist 2.4 ?3.1 6 ?5 Dig V - Wrist 78                   F  Wave    Nerve F Lat Ref.   ms ms  L Ulnar - ADM 31.3 ?32.0  R Ulnar - ADM 31.3 ?32.0         EMG Summary Table    Spontaneous MUAP Recruitment  Muscle IA Fib PSW Fasc Other Amp Dur. Poly Pattern  R. Deltoid Normal None None None _______ Normal Normal Normal Normal  R. Triceps brachii Normal None None None _______ Normal Increased 1+ Reduced  R. Biceps brachii Normal None None None _______ Normal Normal Normal Normal  R. Extensor digitorum communis Normal None None None _______ Normal Normal Normal Normal  R. Flexor pollicis longus Normal None None None _______ Increased Normal 1+ Reduced  R. First dorsal interosseous Normal None None None _______ Normal Normal Normal Normal  L. Deltoid Normal None None None _______ Increased Increased 1+ Reduced  L. Biceps brachii Normal None None None _______ Normal Normal 1+ Normal  L. Triceps brachii Normal None None None _______ Increased Increased 1+ Reduced  L. Extensor digitorum communis Normal None None None _______ Normal Normal 1+ Reduced  L. Flexor pollicis longus Normal None None None _______ Normal Normal 1+ Normal  L. First dorsal interosseous Normal None None None _______ Normal Normal Normal Normal

## 2021-05-29 ENCOUNTER — Telehealth: Payer: Self-pay | Admitting: Neurology

## 2021-05-29 NOTE — Telephone Encounter (Signed)
MRI cervical spine wo contrast approved by [employer] via [employer] pre-cert website. [employer] [place] #[id number] (05/29/21- 06/27/21). I called patient and LVM about scheduling at [facility]. Asked him to return my call.

## 2021-06-06 NOTE — Telephone Encounter (Signed)
Patient called back and LVM wanting to schedule MRI. Could one of you please call him next week to get him scheduled

## 2021-06-06 NOTE — Telephone Encounter (Signed)
Scheduled at [facility] 06/11/21 2PM

## 2021-06-11 ENCOUNTER — Ambulatory Visit: Payer: BC Managed Care – PPO

## 2021-06-11 ENCOUNTER — Other Ambulatory Visit: Payer: Self-pay

## 2021-06-11 DIAGNOSIS — R200 Anesthesia of skin: Secondary | ICD-10-CM

## 2021-06-11 DIAGNOSIS — M5412 Radiculopathy, cervical region: Secondary | ICD-10-CM | POA: Diagnosis not present

## 2021-06-13 ENCOUNTER — Telehealth: Payer: Self-pay | Admitting: *Deleted

## 2021-06-13 NOTE — Telephone Encounter (Signed)
LVM for pt to call about results. °

## 2021-06-13 NOTE — Telephone Encounter (Signed)
LVM for pt to call office back.

## 2021-06-13 NOTE — Telephone Encounter (Signed)
Left second message for patient. Provided our call back number and office hours.

## 2021-06-13 NOTE — Telephone Encounter (Signed)
-----   Message from Asa Lente, MD sent at 06/12/2021  8:05 PM EDT ----- Please let him know that the MRI of the cervical spine shows degenerative changes mostly at C4-C5 and C5-C6 where there is spinal stenosis and changes more to the left.  This could affect the left C6 nerve root..  If he would like we could refer him for an epidural steroid injection for the left C6 nerve root.

## 2021-06-13 NOTE — Telephone Encounter (Signed)
Pt returned phone call, would like a call back.  

## 2021-06-17 NOTE — Telephone Encounter (Signed)
Left detailed message for pt about results (ok per DPR). Advised him to call back if he would like to move forward w/ ESI and we can then place referral.

## 2021-08-06 ENCOUNTER — Telehealth: Payer: Self-pay | Admitting: Neurology

## 2021-08-06 ENCOUNTER — Ambulatory Visit: Payer: BC Managed Care – PPO | Admitting: Neurology

## 2021-08-06 NOTE — Telephone Encounter (Signed)
noted 

## 2021-08-06 NOTE — Telephone Encounter (Signed)
FYI- Pt was very sick this morning, could barely move without having to throw up. Pt apologies because he really wanted to come but he just can't do it.

## 2021-08-06 NOTE — Telephone Encounter (Signed)
ERROR

## 2021-08-20 ENCOUNTER — Ambulatory Visit: Payer: BC Managed Care – PPO | Admitting: Neurology

## 2021-08-20 ENCOUNTER — Encounter: Payer: Self-pay | Admitting: Neurology

## 2021-08-20 VITALS — BP 117/76 | HR 60 | Ht 72.0 in | Wt 179.0 lb

## 2021-08-20 DIAGNOSIS — R200 Anesthesia of skin: Secondary | ICD-10-CM | POA: Diagnosis not present

## 2021-08-20 DIAGNOSIS — M5020 Other cervical disc displacement, unspecified cervical region: Secondary | ICD-10-CM

## 2021-08-20 DIAGNOSIS — M5412 Radiculopathy, cervical region: Secondary | ICD-10-CM | POA: Diagnosis not present

## 2021-08-20 DIAGNOSIS — F419 Anxiety disorder, unspecified: Secondary | ICD-10-CM | POA: Diagnosis not present

## 2021-08-20 MED ORDER — PREGABALIN 75 MG PO CAPS: 75.0000 mg | ORAL_CAPSULE | Freq: Two times a day (BID) | ORAL | 3 refills | Status: DC

## 2021-08-20 NOTE — Progress Notes (Signed)
[facility]  PATIENT: Gregg King DOB: Feb 11, 1986  REFERRING DOCTOR OR PCP:  Dr. Melvyn Novas (Ortho); Roslynn Amble (PCP); Dr. Valera Castle (psych) SOURCE: Patient, notes from orthopedics, imaging reports, multiple MRI and CT images personally reviewed.  _________________________________   HISTORICAL  CHIEF COMPLAINT:  Chief Complaint  Patient presents with   Follow-up    Rm 1, alone. Here for 3 month f/u for L arm numbness. Pt was only able to take gabapentin for a month due to extreme drowsiness. Pt will be receiving a 3rd cortisone injection next week, on his neck. Pt still has loss of sensation on his elbow and pins//neede in arm. Having trouble lifting things.     HISTORY OF PRESENT ILLNESS:   Gregg King is a 35 year old man who had the onset of left arm numbness since 2019.    Update 08/20/2021: After I last saw him, gabapentin was starrted.   He felt sedated so he weaned off 300 mg po tid.   He did feel pain was better.     He reports Vyvanse was started and that has helped his energy and focus. He had a second ESI (he sees Dr. Retia Passe)  Current pain and numbness is down the left > right arm .  Worse symptoms are into the left elbow and into 5th finger (of note, no C7T1 pathology on MRI and no ulnar issue on NCV)   He has no had 2 ESI and last one did not help much  He also reports back and leg pain also.  He notes his walking worse due to increased back pain.  Legs hurt more at night.  He is taking Naproxen and prn vicodin (just at night if wakes up in pain) .   He deines difficulty with his bladder/bowel.    He has anxiety, that worsened recently prompting increase in his Buspar .    When he has more pain, his anxiety increases.   He has had some tremoring in the left arm.  This can happen randomly but is more likely to happen when he has more pain and sometimes more anxiety.  I reviewed his cervical spine MRI in his presence.  He  did PT at [facility].  He felt he may have had some benefit.     History of arm pain/numbness In late 2019,  he started to have tremors in his left arm, often worse with pain and anxiety.     Compared to a year ago, he feels a little better.   He currently has numbness from the armpit on down to all his fingers.   He notes a little weakness in arm muscles, in a nonspecific manner.Imaging shows disc protrusions to the left at C3-C4 and C4-C5 and a disc herniation to the left at C5-C6.  These can affect the left C4, C5 and C6 nerve roots.  The spinal cord has normal signal.  In December 2018, he laid on a sewing needle that entered the left shoulder area.   He went to the ED and was told nothing vital was hit.   The needle migrated and he had surgery to remove it 2 weeks later.   He was told that it did not pass to anything vital.  Additionally, he had a few traumas over the past few years.   In summer 2020 he had a fall in the bathroom - hitting his side not the head.   He hit his head on a staircase several times.  One time 01/29/2019, he hit his head hard and had vertigo but no LOC.     Imaging scans personally reviewed: CT scan brain and cervical spine 02/17/20.  The brain was normal.  Cervical spine showed no bony pathology.  However, disc protrusions to the left were noted at C3-C4, C4-C5 and C5-C6.  MRI of the brain and cervical spine 03/04/2020 showed disc protrusions at C3-C4, C4-C5 to the left and disc herniation to the left at C5-C6.  There was potential for left C6 nerve root compression and to a lesser extent involvement of the C4-C5 nerve roots.  Additionally, there was stenosis of the spinal canal on the left, especially at C5-C6.  The spinal cord has normal signal.  The brain was essentially normal.  MRI cervical spine 06/11/2021 showed: 1.   At C4-C5, there is mild to moderate spinal stenosis and mild to moderate left foraminal narrowing due to left greater than right disc osteophyte complexes superimposed  on disc bulging.  There does not appear to be nerve root compression. 2.   At C5-C6, there is mild spinal stenosis and moderate left foraminal narrowing due to left greater than right disc osteophyte complexes.  This could affect the left C6 nerve root. 3.   Milder degenerative changes at C3-C4, C6-C7 and T1-T2 that do not lead to spinal stenosis or nerve root compression. 4.   3  mm cerebellar ectopia.  This is not enough to be considered a Chiari malformation. 5.   The spinal cord appears normal.  MRI of the lumbar and thoracic spine 03/27/2020 shows a disc protrusion at L3-L4 but no nerve root compression.  EMG/NCV 05/23/2021 showed mpression: This NCV/EMG study shows the following: Mild chronic left C6 and C7 radiculopathies and mild chronic right C7 radiculopathy.  There were no active features. No evidence of median or ulnar mononeuropathies. REVIEW OF SYSTEMS: Constitutional: No fevers, chills, sweats, or change in appetite Eyes: No visual changes, double vision, eye pain Ear, nose and throat: No hearing loss, ear pain, nasal congestion, sore throat Cardiovascular: No chest pain, palpitations Respiratory:  No shortness of breath at rest or with exertion.   No wheezes GastrointestinaI: No nausea, vomiting, diarrhea, abdominal pain, fecal incontinence Genitourinary:  No dysuria, urinary retention or frequency.  No nocturia. Musculoskeletal: Neck pain, back pain Integumentary: No rash, pruritus, skin lesions Neurological: as above Psychiatric: Has anxiety greater than depression. Endocrine: No palpitations, diaphoresis, change in appetite, change in weigh or increased thirst Hematologic/Lymphatic:  No anemia, purpura, petechiae. Allergic/Immunologic: No itchy/runny eyes, nasal congestion, recent allergic reactions, rashes  ALLERGIES: Allergies  Allergen Reactions   Ceclor [Cefaclor] Nausea And Vomiting    HOME MEDICATIONS:  Current Outpatient Medications:    busPIRone (BUSPAR)  15 MG tablet, Take 15 mg by mouth 2 (two) times daily., Disp: , Rfl:    DULoxetine (CYMBALTA) 60 MG capsule, Take 2 capsules by mouth daily., Disp: , Rfl:    HYDROcodone-acetaminophen (NORCO/VICODIN) 5-325 MG tablet, Take 1 tablet by mouth 2 (two) times daily as needed., Disp: , Rfl:    loratadine (CLARITIN) 10 MG tablet, Take 10 mg by mouth daily., Disp: , Rfl:    naproxen sodium (ANAPROX) 220 MG tablet, Take 440 mg by mouth 2 (two) times daily with a meal., Disp: , Rfl:    omeprazole (PRILOSEC) 20 MG capsule, Take 20 mg by mouth daily., Disp: , Rfl:    pregabalin (LYRICA) 75 MG capsule, Take 1 capsule (75 mg total) by mouth 2 (two) times daily.  Take 1 capsule (75 mg total) by mouth in AM and 2 capsules  qHS, Disp: 90 capsule, Rfl: 3   VYVANSE 30 MG capsule, Take 30 mg by mouth every morning., Disp: , Rfl:   PAST MEDICAL HISTORY: Past Medical History:  Diagnosis Date   ADHD (attention deficit hyperactivity disorder)    Chronic back pain    GERD (gastroesophageal reflux disease)     PAST SURGICAL HISTORY: Past Surgical History:  Procedure Laterality Date   LAPAROSCOPIC APPENDECTOMY N/A 04/04/2013   Procedure: APPENDECTOMY LAPAROSCOPIC;  Surgeon: Velora Heckler, MD;  Location: [facility];  Service: General;  Laterality: N/A;   MANDIBLE SURGERY     WISDOM TOOTH EXTRACTION      FAMILY HISTORY: Family History  Problem Relation Age of Onset   Arthritis Paternal Aunt     SOCIAL HISTORY:  Social History   Socioeconomic History   Marital status: Married    Spouse name: Jeanice Lim   Number of children: 2   Years of education: 19   Highest education level: Not on file  Occupational History   Occupation: On disability  Tobacco Use   Smoking status: Never   Smokeless tobacco: Never  Substance and Sexual Activity   Alcohol use: Yes    Comment: ocass   Drug use: Yes    Types: Marijuana    Comment: Uses for pain   Sexual activity: Never  Other Topics Concern   Not on file  Social  History Narrative   Lives with wife and children   Caffeine use: 200mg  per day    Right handed   Social Determinants of Health   Financial Resource Strain: Not on file  Food Insecurity: Not on file  Transportation Needs: Not on file  Physical Activity: Not on file  Stress: Not on file  Social Connections: Not on file  Intimate Partner Violence: Not on file     PHYSICAL EXAM  Vitals:   08/20/21 0833  BP: 117/76  Pulse: 60  Weight: 179 lb (81.2 kg)  Height: 6' (1.829 m)    Body mass index is 24.28 kg/m.   General: The patient is well-developed and well-nourished .  He is hyperkinetic in his chair and has intermittent left arm tremor.   He cracks joints some.   HEENT:  Head is [place]/AT.  Sclera are anicteric.   Neck: The neck is nontender.  Skin: Extremities are without rash or  edema.   Neurologic Exam  Mental status: The patient is alert and oriented x 3 at the time of the examination. The patient has apparent normal recent and remote memory, with an apparently normal attention span and concentration ability.   Speech is normal.  Cranial nerves: Extraocular movements are full.   Facial symmetry is present. There is good facial sensation to soft touch bilaterally.Facial strength is normal.  Trapezius and sternocleidomastoid strength is normal. No dysarthria is noted.  The tongue is midline, and the patient has symmetric elevation of the soft palate. No obvious hearing deficits are noted.  Motor: He had some jerking or a tremor in the left arm a few times during our interaction today.  It was not regular.  Muscle bulk is normal.   Tone is normal. Strength was difficult to assess due to some giveaway but there was possible weakness in the left deltoid and biceps.  Strength was 5/5 elsewhere.  Sensory: Sensory testing was normal in the right arm.  On the left he reported reduced sensation, more over the  hypothenar eminence than the thenar eminence.  Coordination: Cerebellar  testing reveals good finger-nose-finger and heel-to-shin bilaterally.  Gait and station: Station is normal.   Gait was antalgic.  Tandem gait was wide.. Romberg is negative.   Reflexes: Deep tendon reflexes are symmetric and normal bilaterally.       DIAGNOSTIC DATA (LABS, IMAGING, TESTING) - I reviewed patient records, labs, notes, testing and imaging myself where available.  Lab Results  Component Value Date   WBC 12.7 (H) 04/04/2013   HGB 15.3 04/04/2013   HCT 45.0 04/04/2013   MCV 87.4 04/04/2013   PLT 198 04/04/2013      Component Value Date/Time   NA 142 04/04/2013 0441   K 3.9 04/04/2013 0441   CL 104 04/04/2013 0441   GLUCOSE 90 04/04/2013 0441   BUN 10 04/04/2013 0441   CREATININE 0.90 04/04/2013 0441   PROT 7.2 04/04/2013 0420   ALBUMIN 4.0 04/04/2013 0420   AST 21 04/04/2013 0420   ALT 15 04/04/2013 0420   ALKPHOS 72 04/04/2013 0420   BILITOT 0.4 04/04/2013 0420      ASSESSMENT AND PLAN  Cervical herniated disc - Plan: Ambulatory referral to Physical Therapy  Left cervical radiculopathy - Plan: Ambulatory referral to Physical Therapy  Anxiety  Left arm numbness  Right arm numbness  The MRI of the cervical spine shows multilevel degenerative changes, more than typical for his age.  There is potential for left C6 nerve root compression.  These findings could explain some of his symptoms.  I believe the tremor is due to anxiety. He stopped the gabapentin because of somnolence.  Pregabalin sometimes has less of the side effect and I will have him do 75 mg twice a day for few days and then increase to 75 mg in the morning and 150 mg at night. Physical therapy referral Stay active and exercise as tolerated He is being seen at an orthopedic spine practice and will follow up with them if symptoms do not improve.  I discussed with him that because his sensory changes do not match up with the MRI of the cervical spine (sensory changes would be more consistent  with a C8 radiculopathy now with a C6 radiculopathy) I am not sure if he would get a benefit from surgery for the degenerative changes at C5-C6. He will return to see [place] as needed and call for new or worsening neurologic symptoms.  Gregg King A. Epimenio Foot, MD, [facility] 08/20/2021, 9:24 PM Certified in Neurology, Clinical Neurophysiology, Sleep Medicine and Neuroimaging  [facility] [address] [place], [place] [address] [phone]

## 2021-08-21 ENCOUNTER — Telehealth: Payer: Self-pay | Admitting: Neurology

## 2021-08-21 NOTE — Telephone Encounter (Signed)
PT referral sent to [facility] as requested by patient. Phone: [phone].

## 2021-09-18 DIAGNOSIS — Z0271 Encounter for disability determination: Secondary | ICD-10-CM

## 2022-09-16 ENCOUNTER — Ambulatory Visit: Payer: Commercial Managed Care - PPO | Admitting: Neurology

## 2022-09-16 ENCOUNTER — Encounter: Payer: Self-pay | Admitting: Neurology

## 2022-09-16 VITALS — BP 128/85 | HR 88 | Ht 72.0 in | Wt 183.0 lb

## 2022-09-16 DIAGNOSIS — F419 Anxiety disorder, unspecified: Secondary | ICD-10-CM | POA: Diagnosis not present

## 2022-09-16 DIAGNOSIS — M5020 Other cervical disc displacement, unspecified cervical region: Secondary | ICD-10-CM | POA: Diagnosis not present

## 2022-09-16 DIAGNOSIS — M4802 Spinal stenosis, cervical region: Secondary | ICD-10-CM

## 2022-09-16 DIAGNOSIS — M5412 Radiculopathy, cervical region: Secondary | ICD-10-CM | POA: Diagnosis not present

## 2022-09-16 DIAGNOSIS — G8929 Other chronic pain: Secondary | ICD-10-CM

## 2022-09-16 DIAGNOSIS — R208 Other disturbances of skin sensation: Secondary | ICD-10-CM

## 2022-09-16 DIAGNOSIS — M549 Dorsalgia, unspecified: Secondary | ICD-10-CM

## 2022-09-16 MED ORDER — BENZTROPINE MESYLATE 1 MG PO TABS: 1.0000 mg | ORAL_TABLET | Freq: Two times a day (BID) | ORAL | 5 refills | Status: DC

## 2022-09-16 MED ORDER — PREGABALIN 75 MG PO CAPS: 75.0000 mg | ORAL_CAPSULE | Freq: Two times a day (BID) | ORAL | 3 refills | Status: DC

## 2022-09-16 NOTE — Progress Notes (Signed)
[facility]  PATIENT: Gregg King DOB: 11/29/1985  REFERRING DOCTOR OR PCP:  Dr. Melvyn Novas (Ortho); Roslynn Amble (PCP); Dr. Valera Castle (psych) SOURCE: Patient, notes from orthopedics, imaging reports, multiple MRI and CT images personally reviewed.  _________________________________   HISTORICAL  CHIEF COMPLAINT:  Chief Complaint  Patient presents with   Follow-up    Pt in room #1 and alone. Pt here today to f/u on his cervical herniated disc. Pt is having muscle spams. Pt state his has been hurting in both shoulder, elbow and hands.    HISTORY OF PRESENT ILLNESS:  Gregg King is a 36 year old man who had the onset of left arm numbness since 2019.    Update 08/16/2022: He continues to experience some pain mostly in his neck and  shoulders ad to the upper arm but not into the hands.    Neck pain is worse when he wakes up.       He also has had a lot of stomach pain and nausea recently.   He sometimes vomits in the morning.  Last year he had some left sided tremors but now is having more abnormal movements and fidgeting.    He notes trouble holding the arm steady and more spasms in the left arm.    Zyprexa was started 6 months ago.    He states anxiety and movements are worse than typical today.  E is anxious about SSI hearing in December    We had tried gabapentin initially but he felt sedated and we switched to pregabalin.    He did feel pain was better.    He had another ESI by Dr. Retia Passe with benefit.     He also reports back and leg pain also, mostly on his riht (piriformis region and a little higher).   Legs hurt some.   He denies difficulty with his bladder/bowel.  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    He has anxiety, that worsened recently prompting increase in his Buspar .    When he has more pain, his anxiety increases.   He has had some tremoring  in the left arm.  This can happen randomly but is more likely to happen when he has more pain and sometimes more anxiety.  He sees Dr. Valera Castle in [facility].  I reviewed his cervical spine MRI in his presence.  He  did PT at [facility].  He felt he may have had some benefit.     History of arm pain/numbness In late 2019,  he started to have tremors in his left arm, often worse with pain and anxiety.     Compared to a year ago, he feels a little better.   He currently has numbness from the armpit on down to all his fingers.   He notes a little weakness in arm muscles, in a nonspecific manner.Imaging shows disc protrusions to the left at C3-C4 and C4-C5 and a disc herniation to the left at C5-C6.  These can affect the left C4, C5 and C6 nerve roots.  The spinal cord has normal signal.  In December 2018, he laid on a sewing needle that entered the left shoulder area.   He went to the ED and was told nothing vital was hit.   The needle migrated and he had surgery to remove it 2 weeks later.   He was  told that it did not pass to anything vital.  Additionally, he had a few traumas over the past few years.   In summer 2020 he had a fall in the bathroom - hitting his side not the head.   He hit his head on a staircase several times.  One time 01/29/2019, he hit his head hard and had vertigo but no LOC.     Imaging scans personally reviewed: CT scan brain and cervical spine 02/17/20.  The brain was normal.  Cervical spine showed no bony pathology.  However, disc protrusions to the left were noted at C3-C4, C4-C5 and C5-C6.  MRI of the brain and cervical spine 03/04/2020 showed disc protrusions at C3-C4, C4-C5 to the left and disc herniation to the left at C5-C6.  There was potential for left C6 nerve root compression and to a lesser extent involvement of the C4-C5 nerve roots.  Additionally, there was stenosis of the spinal canal on the left, especially at C5-C6.  The spinal cord has normal signal.  The  brain was essentially normal.  MRI cervical spine 06/11/2021 showed: 1.   At C4-C5, there is mild to moderate spinal stenosis and mild to moderate left foraminal narrowing due to left greater than right disc osteophyte complexes superimposed on disc bulging.  There does not appear to be nerve root compression. 2.   At C5-C6, there is mild spinal stenosis and moderate left foraminal narrowing due to left greater than right disc osteophyte complexes.  This could affect the left C6 nerve root. 3.   Milder degenerative changes at C3-C4, C6-C7 and T1-T2 that do not lead to spinal stenosis or nerve root compression. 4.   3  mm cerebellar ectopia.  This is not enough to be considered a Chiari malformation. 5.   The spinal cord appears normal.  MRI of the lumbar and thoracic spine 03/27/2020 shows a disc protrusion at L3-L4 but no nerve root compression.  EMG/NCV 05/23/2021 showed mpression: This NCV/EMG study shows the following: Mild chronic left C6 and C7 radiculopathies and mild chronic right C7 radiculopathy.  There were no active features. No evidence of median or ulnar mononeuropathies.  REVIEW OF SYSTEMS: Constitutional: No fevers, chills, sweats, or change in appetite Eyes: No visual changes, double vision, eye pain Ear, nose and throat: No hearing loss, ear pain, nasal congestion, sore throat Cardiovascular: No chest pain, palpitations Respiratory:  No shortness of breath at rest or with exertion.   No wheezes GastrointestinaI: No nausea, vomiting, diarrhea, abdominal pain, fecal incontinence Genitourinary:  No dysuria, urinary retention or frequency.  No nocturia. Musculoskeletal: Neck pain, back pain Integumentary: No rash, pruritus, skin lesions Neurological: as above Psychiatric: Has anxiety greater than depression. Endocrine: No palpitations, diaphoresis, change in appetite, change in weigh or increased thirst Hematologic/Lymphatic:  No anemia, purpura,  petechiae. Allergic/Immunologic: No itchy/runny eyes, nasal congestion, recent allergic reactions, rashes  ALLERGIES: Allergies  Allergen Reactions   Ceclor [Cefaclor] Nausea And Vomiting    HOME MEDICATIONS:  Current Outpatient Medications:    busPIRone (BUSPAR) 15 MG tablet, Take 15 mg by mouth 2 (two) times daily., Disp: , Rfl:    diclofenac (VOLTAREN) 50 MG EC tablet, Take 50 mg by mouth 2 (two) times daily., Disp: , Rfl:    DULoxetine (CYMBALTA) 60 MG capsule, Take 2 capsules by mouth daily., Disp: , Rfl:    OLANZapine (ZYPREXA) 5 MG tablet, Take 5 mg by mouth 2 (two) times daily., Disp: , Rfl:    omeprazole (PRILOSEC) 20  MG capsule, Take 20 mg by mouth daily., Disp: , Rfl:    VYVANSE 30 MG capsule, Take 30 mg by mouth every morning., Disp: , Rfl:    pregabalin (LYRICA) 75 MG capsule, Take 1 capsule (75 mg total) by mouth 2 (two) times daily. Take 1 capsule (75 mg total) by mouth in AM and 2 capsules  qHS (Patient not taking: Reported on 09/16/2022), Disp: 90 capsule, Rfl: 3  PAST MEDICAL HISTORY: Past Medical History:  Diagnosis Date   ADHD (attention deficit hyperactivity disorder)    Chronic back pain    GERD (gastroesophageal reflux disease)     PAST SURGICAL HISTORY: Past Surgical History:  Procedure Laterality Date   LAPAROSCOPIC APPENDECTOMY N/A 04/04/2013   Procedure: APPENDECTOMY LAPAROSCOPIC;  Surgeon: Velora Heckler, MD;  Location: [facility];  Service: General;  Laterality: N/A;   MANDIBLE SURGERY     WISDOM TOOTH EXTRACTION      FAMILY HISTORY: Family History  Problem Relation Age of Onset   Arthritis Paternal Aunt     SOCIAL HISTORY:  Social History   Socioeconomic History   Marital status: Married    Spouse name: Jeanice Lim   Number of children: 2   Years of education: 19   Highest education level: Not on file  Occupational History   Occupation: On disability  Tobacco Use   Smoking status: Never   Smokeless tobacco: Never  Substance and Sexual  Activity   Alcohol use: Yes    Comment: ocass   Drug use: Yes    Types: Marijuana    Comment: Uses for pain   Sexual activity: Never  Other Topics Concern   Not on file  Social History Narrative   Lives with wife and children   Caffeine use: 200mg  per day    Right handed   Social Determinants of Health   Financial Resource Strain: Not on file  Food Insecurity: Not on file  Transportation Needs: Not on file  Physical Activity: Not on file  Stress: Not on file  Social Connections: Not on file  Intimate Partner Violence: Not on file     PHYSICAL EXAM  Vitals:   09/16/22 1125  BP: 128/85  Pulse: 88  Weight: 183 lb (83 kg)  Height: 6' (1.829 m)    Body mass index is 24.82 kg/m.   General: The patient is well-developed and well-nourished .  He is hyperkinetic in his chair and has intermittent left arm tremor.   He cracks joints some.   HEENT:  Head is [place]/AT.  Sclera are anicteric.   Neck: The neck is nontender.  Skin: Extremities are without rash or  edema.   Neurologic Exam  Mental status: The patient is alert and oriented x 3 at the time of the examination. The patient has apparent normal recent and remote memory, with an apparently normal attention span and concentration ability.   Speech is normal.  Cranial nerves: Extraocular movements are full.   Facial symmetry is present. There is good facial sensation to soft touch bilaterally.Facial strength is normal.  Trapezius and sternocleidomastoid strength is normal. No dysarthria is noted.  The tongue is midline, and the patient has symmetric elevation of the soft palate. No obvious hearing deficits are noted.  Motor: He had jerking in the left arm and to a lesser extent elsewhere throughout the visit today.  No lipsmacking..  Muscle bulk is normal.   Tone is normal.  Strength was 4+/5 in the left deltoid and triceps and 5/5  elsewhere in the arms and legs.  Sensory: Sensory testing was normal in the right arm.  On  the left he reported reduced sensation, more over the hypothenar eminence than the thenar eminence.  Coordination: Cerebellar testing reveals good finger-nose-finger and heel-to-shin bilaterally.  Gait and station: Station is normal.   Gait is mildly wide.  Tandem gait was wide.. Romberg is negative.   Reflexes: Deep tendon reflexes are symmetric and normal bilaterally.       DIAGNOSTIC DATA (LABS, IMAGING, TESTING) - I reviewed patient records, labs, notes, testing and imaging myself where available.  Lab Results  Component Value Date   WBC 12.7 (H) 04/04/2013   HGB 15.3 04/04/2013   HCT 45.0 04/04/2013   MCV 87.4 04/04/2013   PLT 198 04/04/2013      Component Value Date/Time   NA 142 04/04/2013 0441   K 3.9 04/04/2013 0441   CL 104 04/04/2013 0441   GLUCOSE 90 04/04/2013 0441   BUN 10 04/04/2013 0441   CREATININE 0.90 04/04/2013 0441   PROT 7.2 04/04/2013 0420   ALBUMIN 4.0 04/04/2013 0420   AST 21 04/04/2013 0420   ALT 15 04/04/2013 0420   ALKPHOS 72 04/04/2013 0420   BILITOT 0.4 04/04/2013 0420      ASSESSMENT AND PLAN  Cervical herniated disc  Left cervical radiculopathy  Chronic back pain, unspecified back location, unspecified back pain laterality  Anxiety  Dysesthesia  Cervical stenosis of spinal canal  Due to worsening left arm pain and known spinal stenosis we will check MRI cervical spine to determine if there has been progression that would require any intervention.  Pregabalin 75 mg in the morning and 150 mg at night. He is very fidgety and has tremors and some posturing of the left arm .  No lip smacking or abnormal eye movements.   He is on Zyprexa.  I will add cogentin to see if that helps the symptoms and advised him to discuss further with psychiatry to see if a change in medication may be recommended. Stay active and exercise as tolerated He will return to see [place] as needed and call for new or worsening neurologic symptoms.  41-minute  office visit with the majority of the time spent face-to-face for history and physical, discussion/counseling and decision-making.  Additional time with record review and documentation.  Curlie Sittner A. Felecia Shelling, MD, [facility] 56/01/1496, 02:63 AM Certified in Neurology, Clinical Neurophysiology, Sleep Medicine and Neuroimaging  [facility] [address], [place], [place] [address] [phone]

## 2023-02-25 ENCOUNTER — Other Ambulatory Visit: Payer: Self-pay | Admitting: *Deleted

## 2023-02-25 DIAGNOSIS — M5116 Intervertebral disc disorders with radiculopathy, lumbar region: Secondary | ICD-10-CM

## 2023-02-25 DIAGNOSIS — M47816 Spondylosis without myelopathy or radiculopathy, lumbar region: Secondary | ICD-10-CM

## 2023-02-25 DIAGNOSIS — M5416 Radiculopathy, lumbar region: Secondary | ICD-10-CM

## 2023-02-25 DIAGNOSIS — M4726 Other spondylosis with radiculopathy, lumbar region: Secondary | ICD-10-CM

## 2023-03-18 ENCOUNTER — Encounter: Payer: Self-pay | Admitting: *Deleted

## 2023-03-22 ENCOUNTER — Ambulatory Visit
Admission: RE | Admit: 2023-03-22 | Discharge: 2023-03-22 | Disposition: A | Discharge status: Home / Self Care | Payer: Self-pay | Source: Ambulatory Visit | Attending: *Deleted | Admitting: *Deleted

## 2023-03-22 DIAGNOSIS — M5116 Intervertebral disc disorders with radiculopathy, lumbar region: Secondary | ICD-10-CM

## 2023-03-22 DIAGNOSIS — M4726 Other spondylosis with radiculopathy, lumbar region: Secondary | ICD-10-CM

## 2023-03-22 DIAGNOSIS — M47816 Spondylosis without myelopathy or radiculopathy, lumbar region: Secondary | ICD-10-CM

## 2023-03-22 DIAGNOSIS — M5416 Radiculopathy, lumbar region: Secondary | ICD-10-CM

## 2023-04-23 DIAGNOSIS — G5603 Carpal tunnel syndrome, bilateral upper limbs: Secondary | ICD-10-CM | POA: Insufficient documentation

## 2023-06-02 ENCOUNTER — Ambulatory Visit (INDEPENDENT_AMBULATORY_CARE_PROVIDER_SITE_OTHER): Payer: Self-pay | Admitting: Psychiatry

## 2023-06-02 DIAGNOSIS — Z91199 Patient's noncompliance with other medical treatment and regimen due to unspecified reason: Secondary | ICD-10-CM

## 2023-06-02 NOTE — Progress Notes (Signed)
No show for new appt

## 2023-06-27 DIAGNOSIS — M2550 Pain in unspecified joint: Secondary | ICD-10-CM | POA: Insufficient documentation

## 2023-07-22 ENCOUNTER — Encounter: Payer: Self-pay | Admitting: Psychiatry

## 2023-07-22 ENCOUNTER — Ambulatory Visit: Payer: Commercial Managed Care - PPO | Admitting: Psychiatry

## 2023-07-22 VITALS — BP 132/93 | HR 82 | Ht 73.0 in | Wt 183.0 lb

## 2023-07-22 DIAGNOSIS — Z8659 Personal history of other mental and behavioral disorders: Secondary | ICD-10-CM

## 2023-07-22 DIAGNOSIS — F4010 Social phobia, unspecified: Secondary | ICD-10-CM | POA: Diagnosis not present

## 2023-07-22 DIAGNOSIS — F411 Generalized anxiety disorder: Secondary | ICD-10-CM | POA: Diagnosis not present

## 2023-07-22 DIAGNOSIS — G8921 Chronic pain due to trauma: Secondary | ICD-10-CM

## 2023-07-22 DIAGNOSIS — F902 Attention-deficit hyperactivity disorder, combined type: Secondary | ICD-10-CM

## 2023-07-22 MED ORDER — VYVANSE 30 MG PO CAPS: 30.0000 mg | ORAL_CAPSULE | Freq: Every morning | ORAL | 0 refills | Status: DC

## 2023-07-22 MED ORDER — BUSPIRONE HCL 15 MG PO TABS: 30.0000 mg | ORAL_TABLET | Freq: Two times a day (BID) | ORAL | 0 refills | Status: DC
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
## 2023-08-18 ENCOUNTER — Other Ambulatory Visit: Payer: Self-pay | Admitting: Psychiatry

## 2023-08-18 DIAGNOSIS — F902 Attention-deficit hyperactivity disorder, combined type: Secondary | ICD-10-CM

## 2023-09-09 ENCOUNTER — Encounter: Payer: Self-pay | Admitting: Psychiatry

## 2023-09-09 ENCOUNTER — Other Ambulatory Visit (HOSPITAL_BASED_OUTPATIENT_CLINIC_OR_DEPARTMENT_OTHER): Payer: Self-pay

## 2023-09-09 ENCOUNTER — Ambulatory Visit (INDEPENDENT_AMBULATORY_CARE_PROVIDER_SITE_OTHER): Payer: Commercial Managed Care - PPO | Admitting: Psychiatry

## 2023-09-09 DIAGNOSIS — Z8659 Personal history of other mental and behavioral disorders: Secondary | ICD-10-CM | POA: Diagnosis not present

## 2023-09-09 DIAGNOSIS — F4010 Social phobia, unspecified: Secondary | ICD-10-CM

## 2023-09-09 DIAGNOSIS — F902 Attention-deficit hyperactivity disorder, combined type: Secondary | ICD-10-CM

## 2023-09-09 DIAGNOSIS — F411 Generalized anxiety disorder: Secondary | ICD-10-CM | POA: Diagnosis not present

## 2023-09-09 DIAGNOSIS — F5105 Insomnia due to other mental disorder: Secondary | ICD-10-CM

## 2023-09-09 DIAGNOSIS — G8921 Chronic pain due to trauma: Secondary | ICD-10-CM

## 2023-09-09 MED ORDER — LISDEXAMFETAMINE DIMESYLATE 40 MG PO CAPS
40.0000 mg | ORAL_CAPSULE | ORAL | 0 refills | Status: DC
  Filled 2023-09-09: qty 30, 30d supply, fill #0

## 2023-09-09 MED ORDER — TRAZODONE HCL 50 MG PO TABS: ORAL_TABLET | ORAL | 0 refills | Status: DC

## 2023-09-09 NOTE — Progress Notes (Signed)
Gregg King [id number] 1986-07-31 37 y.o.  Subjective:   Patient ID:  Gregg King is a 37 y.o. (DOB 11-07-1986) male.  Chief Complaint:  Chief Complaint  Patient presents with   Follow-up   Anxiety   ADD   Sleeping Problem    HPI Gregg King presents to the office today for follow-up of anxiety and history of depression and ADD.  First seen 07/22/23.  No med changes.  Taking duloxetine 120 mg daily, buspirone 15 mg tab 2 twice daily, olanzapine 5 mg but he was not taking. None bc sleepy with Lyrica 75 mg, 1 in the morning and 2 at night, Vyvanse 30 mg every morning Alcohol 1 shot every couple weeks. Using CBD/THC at night for sleep and pain.  10% strength 0.25 mg HS.  Relaxes him and able to go to sleep and helps nausea and appetite.   Only used in day for pain 1 every 3-4 days. No history of addiction Applied for disability DT mm pain in arms and legs.  09/09/23 appt noted: Meds: duloxetine 120, buspirone 15 mg tab 2 BID, Vyvanse 30 mg AM, forgets diclofenac bc more effective.  No olanzapine for about a year, nor Lyrica. Married.   Poor sleep for last 2 weeks since URI.  In past when this happens gets irreg sleep and then gets anxiety about it. Wife notes more mood swings with anger, most often in the second part of the day.  She feels like it is worse lately. Longstanding tendency to get irritable and have flashes of anger.    In past has gotten into trouble with anger.   He says he gets overstimulated later in the day.  Not sure why but wonders if increase in Vyvanse.  Feels good about getting things done until about 2 pm.  Then starts getting tired. Anxiety about sleeep.  Anxiety was better until the insomnia.   At times impulsive with kids getting angry with voice and then quickly regrets it.   Obs thoughts in past were mainly about social interactions, would ruminate on it.  Would continue worry after the fact for something wrong he did.   It is better than in the past now ,  but this was reason he took olanzapine.  Tends to dwell too much on things.   Has worked on some CBT to let go of things for a year.  Is better. Not down but more "fight or flight" and dread.   Yesterday   Past Psychiatric History: 1st psych in college dx ADHD Last psych Dr. Judie Petit for a couple of years No SA No psych hosp Current counselor Regis [facility] helps.   Past Psychiatric Medication Trials:   Vyvanse 30, Adderall XR palp and anger, Adzenys better. No Concerta  Buspirone 30 BID Lyrica sleepy, gabapentin sleepy Olanzapine for obs helped and rumination SE tired Xanax , Ativan when had panic.   Duloxetine 120 Citalopram helped Hx trazodone with benefit  [name] Attn Spec:  mild ADD and anxiety on Vyvanse is better, less impulsive and argumentative and calmer.    Review of Systems:  Review of Systems  Musculoskeletal:  Positive for back pain.  Neurological:  Positive for headaches.  Chronic "whole body" pain; has had px seeing doc over this  Medications: I have reviewed the patient's current medications.  Current Outpatient Medications  Medication Sig Dispense Refill   busPIRone (BUSPAR) 15 MG tablet Take 2 tablets (30 mg total) by mouth 2 (two) times daily.  180 tablet 0   diclofenac (VOLTAREN) 50 MG EC tablet Take 50 mg by mouth 2 (two) times daily.     DULoxetine (CYMBALTA) 60 MG capsule Take 2 capsules by mouth daily.     lisdexamfetamine (VYVANSE) 40 MG capsule Take 1 capsule (40 mg total) by mouth every morning. 30 capsule 0   omeprazole (PRILOSEC) 20 MG capsule Take 20 mg by mouth daily.     traZODone (DESYREL) 50 MG tablet 1-2 tablets at night as needed for sleep 90 tablet 0   No current facility-administered medications for this visit.    Medication Side Effects: None  Allergies:  Allergies  Allergen Reactions   Ceclor [Cefaclor] Nausea And Vomiting    Past Medical History:  Diagnosis Date   ADHD (attention deficit hyperactivity disorder)     Chronic back pain    GERD (gastroesophageal reflux disease)     Past Medical History, Surgical history, Social history, and Family history were reviewed and updated as appropriate.   Please see review of systems for further details on the patient's review from today.   Objective:   Physical Exam:  There were no vitals taken for this visit.  Physical Exam Constitutional:      General: He is not in acute distress.    Appearance: He is well-developed.  Musculoskeletal:        General: No deformity.  Neurological:     Mental Status: He is alert and oriented to person, place, and time.     Coordination: Coordination normal.  Psychiatric:        Attention and Perception: He is inattentive.        Mood and Affect: Mood is anxious. Mood is not depressed. Affect is not labile, blunt, angry or inappropriate.        Speech: Speech normal.        Behavior: Behavior normal.        Thought Content: Thought content normal. Thought content is not delusional. Thought content does not include homicidal or suicidal ideation. Thought content does not include suicidal plan.        Cognition and Memory: Cognition and memory normal. Cognition is not impaired.        Judgment: Judgment normal.     Comments: More anxiety than dep now.       Lab Review:     Component Value Date/Time   NA 142 04/04/2013 0441   K 3.9 04/04/2013 0441   CL 104 04/04/2013 0441   GLUCOSE 90 04/04/2013 0441   BUN 10 04/04/2013 0441   CREATININE 0.90 04/04/2013 0441   PROT 7.2 04/04/2013 0420   ALBUMIN 4.0 04/04/2013 0420   AST 21 04/04/2013 0420   ALT 15 04/04/2013 0420   ALKPHOS 72 04/04/2013 0420   BILITOT 0.4 04/04/2013 0420       Component Value Date/Time   WBC 12.7 (H) 04/04/2013 0420   RBC 4.85 04/04/2013 0420   HGB 15.3 04/04/2013 0441   HCT 45.0 04/04/2013 0441   PLT 198 04/04/2013 0420   MCV 87.4 04/04/2013 0420   MCH 30.3 04/04/2013 0420   MCHC 34.7 04/04/2013 0420   RDW 13.1 04/04/2013 0420    LYMPHSABS 2.4 04/04/2013 0420   MONOABS 0.9 04/04/2013 0420   EOSABS 0.2 04/04/2013 0420   BASOSABS 0.0 04/04/2013 0420    No results found for: "POCLITH", "LITHIUM"   No results found for: "PHENYTOIN", "PHENOBARB", "VALPROATE", "CBMZ"   .res Assessment: Plan:    Gregg King was seen today  for follow-up, anxiety, add and sleeping problem.  Diagnoses and all orders for this visit:  Generalized anxiety disorder  Social anxiety disorder  Attention deficit hyperactivity disorder (ADHD), combined type -     lisdexamfetamine (VYVANSE) 40 MG capsule; Take 1 capsule (40 mg total) by mouth every morning.  History of major depression  Chronic pain due to trauma  Insomnia due to mental condition -     traZODone (DESYREL) 50 MG tablet; 1-2 tablets at night as needed for sleep   30 min appt noted.  Reviewed dx and previous hx related and overall tx plan.   Ongoing mood instability and anxiety and insomnia and ADD px.  Would like further med changes.    Consider mood stabilizer for flashes of anger and mood instability.  Will pursue Trileptal off label at [facility] if the other meds do not help this problem.  Recent worsening insomnia making other things worse.  Avoid caffeine after noon.  Mildest sleep appropriated is trazodone.  Disc SE trial 50-100 mg HS.  Disc med for ADD and his questions about increasing the dose.  Ok trial increasing Vyvanse 40 mg Am. Discussed potential benefits, risks, and side effects of stimulants with patient to include increased heart rate, palpitations, insomnia, increased anxiety, increased irritability, or decreased appetite.  Instructed patient to contact office if experiencing any significant tolerability issues.  Continue duloxetine and buspirone  FU 8 weeks.  Meredith Staggers, MD, DFAPA   Please see After Visit Summary for patient specific instructions.  Future Appointments  Date Time Provider Department Center  09/17/2023 10:00 AM Sater, Pearletha Furl, MD  [facility] None  11/12/2023  1:00 PM Cottle, Steva Ready., MD [facility] None    No orders of the defined types were placed in this encounter.   -------------------------------

## 2023-09-15 NOTE — Progress Notes (Unsigned)
[facility]  PATIENT: Gregg King DOB: [birth year]  REFERRING DOCTOR OR PCP:  Dr. Melvyn Novas (Ortho); Roslynn Amble (PCP); Dr. Valera Castle (psych) SOURCE: Patient, notes from orthopedics, imaging reports, multiple MRI and CT images personally reviewed.  _________________________________   HISTORICAL  CHIEF COMPLAINT:  No chief complaint on file.   HISTORY OF PRESENT ILLNESS:  Gregg King is a 37 year old man who had the onset of left arm numbness since 2019.    Update 08/16/2022: He continues to experience some pain mostly in his neck and  shoulders ad to the upper arm but not into the hands.    Neck pain is worse when he wakes up.       He also has had a lot of stomach pain and nausea recently.   He sometimes vomits in the morning.  Last year he had some left sided tremors but now is having more abnormal movements and fidgeting.    He notes trouble holding the arm steady and more spasms in the left arm.    Zyprexa was started 6 months ago.    He states anxiety and movements are worse than typical today.  E is anxious about SSI hearing in December    We had tried gabapentin initially but he felt sedated and we switched to pregabalin.    He did feel pain was better.    He had another ESI by Dr. Retia Passe with benefit.     He also reports back and leg pain also, mostly on his riht (piriformis region and a little higher).   Legs hurt some.   He denies difficulty with his bladder/bowel.  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    He has anxiety, that worsened recently prompting increase in his Buspar .    When he has more pain, his anxiety increases.   He has had some tremoring in the left arm.  This can happen randomly but is more likely to happen when he has more pain and sometimes more anxiety.  He sees Dr. Valera Castle in [facility].  I reviewed his cervical spine MRI in  his presence.  He  did PT at [facility].  He felt he may have had some benefit.     History of arm pain/numbness In late 2019,  he started to have tremors in his left arm, often worse with pain and anxiety.     Compared to a year ago, he feels a little better.   He currently has numbness from the armpit on down to all his fingers.   He notes a little weakness in arm muscles, in a nonspecific manner.Imaging shows disc protrusions to the left at C3-C4 and C4-C5 and a disc herniation to the left at C5-C6.  These can affect the left C4, C5 and C6 nerve roots.  The spinal cord has normal signal.  In December 2018, he laid on a sewing needle that entered the left shoulder area.   He went to the ED and was told nothing vital was hit.   The needle migrated and he had surgery to remove it 2 weeks later.   He was told that it did not pass to anything vital.  Additionally, he had a few traumas over the past few years.   In summer 2020 he had a fall in the bathroom - hitting his side not the head.  He hit his head on a staircase several times.  One time 01/29/2019, he hit his head hard and had vertigo but no LOC.     Imaging scans personally reviewed: CT scan brain and cervical spine 02/17/20.  The brain was normal.  Cervical spine showed no bony pathology.  However, disc protrusions to the left were noted at C3-C4, C4-C5 and C5-C6.  MRI of the brain and cervical spine 03/04/2020 showed disc protrusions at C3-C4, C4-C5 to the left and disc herniation to the left at C5-C6.  There was potential for left C6 nerve root compression and to a lesser extent involvement of the C4-C5 nerve roots.  Additionally, there was stenosis of the spinal canal on the left, especially at C5-C6.  The spinal cord has normal signal.  The brain was essentially normal.  MRI cervical spine 06/11/2021 showed: 1.   At C4-C5, there is mild to moderate spinal stenosis and mild to moderate left foraminal narrowing due to left greater than  right disc osteophyte complexes superimposed on disc bulging.  There does not appear to be nerve root compression. 2.   At C5-C6, there is mild spinal stenosis and moderate left foraminal narrowing due to left greater than right disc osteophyte complexes.  This could affect the left C6 nerve root. 3.   Milder degenerative changes at C3-C4, C6-C7 and T1-T2 that do not lead to spinal stenosis or nerve root compression. 4.   3  mm cerebellar ectopia.  This is not enough to be considered a Chiari malformation. 5.   The spinal cord appears normal.  MRI of the lumbar and thoracic spine 03/27/2020 shows a disc protrusion at L3-L4 but no nerve root compression.  EMG/NCV 05/23/2021 showed mpression: This NCV/EMG study shows the following: Mild chronic left C6 and C7 radiculopathies and mild chronic right C7 radiculopathy.  There were no active features. No evidence of median or ulnar mononeuropathies.  REVIEW OF SYSTEMS: Constitutional: No fevers, chills, sweats, or change in appetite Eyes: No visual changes, double vision, eye pain Ear, nose and throat: No hearing loss, ear pain, nasal congestion, sore throat Cardiovascular: No chest pain, palpitations Respiratory:  No shortness of breath at rest or with exertion.   No wheezes GastrointestinaI: No nausea, vomiting, diarrhea, abdominal pain, fecal incontinence Genitourinary:  No dysuria, urinary retention or frequency.  No nocturia. Musculoskeletal: Neck pain, back pain Integumentary: No rash, pruritus, skin lesions Neurological: as above Psychiatric: Has anxiety greater than depression. Endocrine: No palpitations, diaphoresis, change in appetite, change in weigh or increased thirst Hematologic/Lymphatic:  No anemia, purpura, petechiae. Allergic/Immunologic: No itchy/runny eyes, nasal congestion, recent allergic reactions, rashes  ALLERGIES: Allergies  Allergen Reactions   Ceclor [Cefaclor] Nausea And Vomiting    HOME MEDICATIONS:  Current  Outpatient Medications:    busPIRone (BUSPAR) 15 MG tablet, Take 2 tablets (30 mg total) by mouth 2 (two) times daily., Disp: 180 tablet, Rfl: 0   diclofenac (VOLTAREN) 50 MG EC tablet, Take 50 mg by mouth 2 (two) times daily., Disp: , Rfl:    DULoxetine (CYMBALTA) 60 MG capsule, Take 2 capsules by mouth daily., Disp: , Rfl:    lisdexamfetamine (VYVANSE) 40 MG capsule, Take 1 capsule (40 mg total) by mouth every morning., Disp: 30 capsule, Rfl: 0   omeprazole (PRILOSEC) 20 MG capsule, Take 20 mg by mouth daily., Disp: , Rfl:    traZODone (DESYREL) 50 MG tablet, 1-2 tablets at night as needed for sleep, Disp: 90 tablet, Rfl: 0  PAST MEDICAL HISTORY:  Past Medical History:  Diagnosis Date   ADHD (attention deficit hyperactivity disorder)    Chronic back pain    GERD (gastroesophageal reflux disease)     PAST SURGICAL HISTORY: Past Surgical History:  Procedure Laterality Date   LAPAROSCOPIC APPENDECTOMY N/A 04/04/2013   Procedure: APPENDECTOMY LAPAROSCOPIC;  Surgeon: Velora Heckler, MD;  Location: [facility];  Service: General;  Laterality: N/A;   MANDIBLE SURGERY     WISDOM TOOTH EXTRACTION      FAMILY HISTORY: Family History  Problem Relation Age of Onset   Arthritis Paternal Aunt     SOCIAL HISTORY:  Social History   Socioeconomic History   Marital status: Married    Spouse name: Jeanice Lim   Number of children: 2   Years of education: 19   Highest education level: Not on file  Occupational History   Occupation: On disability  Tobacco Use   Smoking status: Never   Smokeless tobacco: Never  Substance and Sexual Activity   Alcohol use: Yes    Comment: ocass   Drug use: Yes    Types: Marijuana    Comment: Uses for pain   Sexual activity: Never  Other Topics Concern   Not on file  Social History Narrative   Lives with wife and children   Caffeine use: 200mg  per day    Right handed   Social Determinants of Health   Financial Resource Strain: Not on file  Food Insecurity:  Not on file  Transportation Needs: Not on file  Physical Activity: Not on file  Stress: Not on file  Social Connections: Not on file  Intimate Partner Violence: Not on file     PHYSICAL EXAM  There were no vitals filed for this visit.   There is no height or weight on file to calculate BMI.   General: The patient is well-developed and well-nourished .  He is hyperkinetic in his chair and has intermittent left arm tremor.   He cracks joints some.   HEENT:  Head is [place]/AT.  Sclera are anicteric.   Neck: The neck is nontender.  Skin: Extremities are without rash or  edema.   Neurologic Exam  Mental status: The patient is alert and oriented x 3 at the time of the examination. The patient has apparent normal recent and remote memory, with an apparently normal attention span and concentration ability.   Speech is normal.  Cranial nerves: Extraocular movements are full.   Facial symmetry is present. There is good facial sensation to soft touch bilaterally.Facial strength is normal.  Trapezius and sternocleidomastoid strength is normal. No dysarthria is noted.  The tongue is midline, and the patient has symmetric elevation of the soft palate. No obvious hearing deficits are noted.  Motor: He had jerking in the left arm and to a lesser extent elsewhere throughout the visit today.  No lipsmacking..  Muscle bulk is normal.   Tone is normal.  Strength was 4+/5 in the left deltoid and triceps and 5/5 elsewhere in the arms and legs.  Sensory: Sensory testing was normal in the right arm.  On the left he reported reduced sensation, more over the hypothenar eminence than the thenar eminence.  Coordination: Cerebellar testing reveals good finger-nose-finger and heel-to-shin bilaterally.  Gait and station: Station is normal.   Gait is mildly wide.  Tandem gait was wide.. Romberg is negative.   Reflexes: Deep tendon reflexes are symmetric and normal bilaterally.       DIAGNOSTIC DATA (LABS,  IMAGING, TESTING) -  I reviewed patient records, labs, notes, testing and imaging myself where available.  Lab Results  Component Value Date   WBC 12.7 (H) 04/04/2013   HGB 15.3 04/04/2013   HCT 45.0 04/04/2013   MCV 87.4 04/04/2013   PLT 198 04/04/2013      Component Value Date/Time   NA 142 04/04/2013 0441   K 3.9 04/04/2013 0441   CL 104 04/04/2013 0441   GLUCOSE 90 04/04/2013 0441   BUN 10 04/04/2013 0441   CREATININE 0.90 04/04/2013 0441   PROT 7.2 04/04/2013 0420   ALBUMIN 4.0 04/04/2013 0420   AST 21 04/04/2013 0420   ALT 15 04/04/2013 0420   ALKPHOS 72 04/04/2013 0420   BILITOT 0.4 04/04/2013 0420      ASSESSMENT AND PLAN  No diagnosis found.  Due to worsening left arm pain and known spinal stenosis we will check MRI cervical spine to determine if there has been progression that would require any intervention.  Pregabalin 75 mg in the morning and 150 mg at night. He is very fidgety and has tremors and some posturing of the left arm .  No lip smacking or abnormal eye movements.   He is on Zyprexa.  I will add cogentin to see if that helps the symptoms and advised him to discuss further with psychiatry to see if a change in medication may be recommended. Stay active and exercise as tolerated He will return to see [place] as needed and call for new or worsening neurologic symptoms.  41-minute office visit with the majority of the time spent face-to-face for history and physical, discussion/counseling and decision-making.  Additional time with record review and documentation.  Kinser Fellman A. Epimenio Foot, MD, [facility] 09/15/2023, 3:54 PM Certified in Neurology, Clinical Neurophysiology, Sleep Medicine and Neuroimaging  [facility] [address], [address] [place], [place] [address] [phone]

## 2023-09-16 ENCOUNTER — Other Ambulatory Visit: Payer: Self-pay | Admitting: Psychiatry

## 2023-09-16 DIAGNOSIS — F4010 Social phobia, unspecified: Secondary | ICD-10-CM

## 2023-09-16 DIAGNOSIS — F411 Generalized anxiety disorder: Secondary | ICD-10-CM

## 2023-09-16 NOTE — Telephone Encounter (Signed)
LF 09/11;LV 10/30; NV 1/2

## 2023-09-17 ENCOUNTER — Ambulatory Visit: Payer: Commercial Managed Care - PPO | Admitting: Neurology

## 2023-09-17 ENCOUNTER — Encounter: Payer: Self-pay | Admitting: Neurology

## 2023-09-17 VITALS — BP 114/74 | HR 80 | Ht 73.0 in | Wt 180.0 lb

## 2023-09-17 DIAGNOSIS — M5020 Other cervical disc displacement, unspecified cervical region: Secondary | ICD-10-CM

## 2023-09-17 DIAGNOSIS — F419 Anxiety disorder, unspecified: Secondary | ICD-10-CM | POA: Diagnosis not present

## 2023-09-17 DIAGNOSIS — M5412 Radiculopathy, cervical region: Secondary | ICD-10-CM | POA: Diagnosis not present

## 2023-09-17 DIAGNOSIS — R200 Anesthesia of skin: Secondary | ICD-10-CM | POA: Diagnosis not present

## 2023-09-17 DIAGNOSIS — G5603 Carpal tunnel syndrome, bilateral upper limbs: Secondary | ICD-10-CM

## 2023-10-12 ENCOUNTER — Other Ambulatory Visit (HOSPITAL_BASED_OUTPATIENT_CLINIC_OR_DEPARTMENT_OTHER): Payer: Self-pay

## 2023-10-12 ENCOUNTER — Other Ambulatory Visit: Payer: Self-pay | Admitting: Psychiatry

## 2023-10-12 DIAGNOSIS — F902 Attention-deficit hyperactivity disorder, combined type: Secondary | ICD-10-CM

## 2023-10-12 MED ORDER — LISDEXAMFETAMINE DIMESYLATE 40 MG PO CAPS
40.0000 mg | ORAL_CAPSULE | ORAL | 0 refills | Status: DC
  Filled 2023-10-12: qty 30, 30d supply, fill #0
  Filled 2023-10-13: qty 20, 20d supply, fill #0

## 2023-10-12 NOTE — Telephone Encounter (Signed)
LF 10/30; LV 10/30

## 2023-10-13 ENCOUNTER — Other Ambulatory Visit: Payer: Self-pay

## 2023-10-13 ENCOUNTER — Other Ambulatory Visit (HOSPITAL_BASED_OUTPATIENT_CLINIC_OR_DEPARTMENT_OTHER): Payer: Self-pay

## 2023-11-12 ENCOUNTER — Ambulatory Visit: Payer: Commercial Managed Care - PPO | Admitting: Psychiatry

## 2023-11-12 ENCOUNTER — Other Ambulatory Visit: Payer: Self-pay

## 2023-11-12 ENCOUNTER — Other Ambulatory Visit (HOSPITAL_BASED_OUTPATIENT_CLINIC_OR_DEPARTMENT_OTHER): Payer: Self-pay

## 2023-11-12 ENCOUNTER — Encounter: Payer: Self-pay | Admitting: Psychiatry

## 2023-11-12 DIAGNOSIS — G8921 Chronic pain due to trauma: Secondary | ICD-10-CM

## 2023-11-12 DIAGNOSIS — F902 Attention-deficit hyperactivity disorder, combined type: Secondary | ICD-10-CM | POA: Diagnosis not present

## 2023-11-12 DIAGNOSIS — F4010 Social phobia, unspecified: Secondary | ICD-10-CM

## 2023-11-12 DIAGNOSIS — F411 Generalized anxiety disorder: Secondary | ICD-10-CM | POA: Diagnosis not present

## 2023-11-12 DIAGNOSIS — Z8659 Personal history of other mental and behavioral disorders: Secondary | ICD-10-CM

## 2023-11-12 DIAGNOSIS — F5105 Insomnia due to other mental disorder: Secondary | ICD-10-CM

## 2023-11-12 MED ORDER — ARIPIPRAZOLE 5 MG PO TABS
5.0000 mg | ORAL_TABLET | Freq: Every day | ORAL | 0 refills | Status: DC
  Filled 2023-11-12: qty 30, 30d supply, fill #0

## 2023-11-12 MED ORDER — LISDEXAMFETAMINE DIMESYLATE 30 MG PO CAPS
30.0000 mg | ORAL_CAPSULE | ORAL | 0 refills | Status: DC
  Filled 2023-11-12: qty 30, 30d supply, fill #0
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
## 2023-12-17 ENCOUNTER — Other Ambulatory Visit: Payer: Self-pay | Admitting: Psychiatry

## 2023-12-17 ENCOUNTER — Other Ambulatory Visit (HOSPITAL_BASED_OUTPATIENT_CLINIC_OR_DEPARTMENT_OTHER): Payer: Self-pay

## 2023-12-17 DIAGNOSIS — F411 Generalized anxiety disorder: Secondary | ICD-10-CM

## 2023-12-17 DIAGNOSIS — F902 Attention-deficit hyperactivity disorder, combined type: Secondary | ICD-10-CM

## 2023-12-17 DIAGNOSIS — F4010 Social phobia, unspecified: Secondary | ICD-10-CM

## 2023-12-17 MED ORDER — LISDEXAMFETAMINE DIMESYLATE 30 MG PO CAPS: 30.0000 mg | ORAL_CAPSULE | ORAL | 0 refills | Status: DC

## 2023-12-17 NOTE — Telephone Encounter (Signed)
 Lf 1/2 lv 1/2 nv 2/20

## 2023-12-18 ENCOUNTER — Other Ambulatory Visit (HOSPITAL_BASED_OUTPATIENT_CLINIC_OR_DEPARTMENT_OTHER): Payer: Self-pay

## 2023-12-31 ENCOUNTER — Other Ambulatory Visit (HOSPITAL_BASED_OUTPATIENT_CLINIC_OR_DEPARTMENT_OTHER): Payer: Self-pay

## 2023-12-31 ENCOUNTER — Encounter: Payer: Self-pay | Admitting: Psychiatry

## 2023-12-31 ENCOUNTER — Ambulatory Visit (INDEPENDENT_AMBULATORY_CARE_PROVIDER_SITE_OTHER): Payer: Commercial Managed Care - PPO | Admitting: Psychiatry

## 2023-12-31 DIAGNOSIS — F4010 Social phobia, unspecified: Secondary | ICD-10-CM | POA: Diagnosis not present

## 2023-12-31 DIAGNOSIS — F902 Attention-deficit hyperactivity disorder, combined type: Secondary | ICD-10-CM

## 2023-12-31 DIAGNOSIS — G8921 Chronic pain due to trauma: Secondary | ICD-10-CM | POA: Diagnosis not present

## 2023-12-31 DIAGNOSIS — F5105 Insomnia due to other mental disorder: Secondary | ICD-10-CM

## 2023-12-31 DIAGNOSIS — F411 Generalized anxiety disorder: Secondary | ICD-10-CM | POA: Diagnosis not present

## 2023-12-31 DIAGNOSIS — Z8659 Personal history of other mental and behavioral disorders: Secondary | ICD-10-CM

## 2023-12-31 MED ORDER — TRAZODONE HCL 50 MG PO TABS: ORAL_TABLET | ORAL | 0 refills | Status: AC

## 2023-12-31 MED ORDER — LISDEXAMFETAMINE DIMESYLATE 30 MG PO CAPS: 30.0000 mg | ORAL_CAPSULE | Freq: Every day | ORAL | 0 refills | Status: DC

## 2023-12-31 MED ORDER — BUSPIRONE HCL 15 MG PO TABS: 30.0000 mg | ORAL_TABLET | Freq: Two times a day (BID) | ORAL | 0 refills | Status: DC

## 2023-12-31 MED ORDER — ARIPIPRAZOLE 5 MG PO TABS: 5.0000 mg | ORAL_TABLET | Freq: Every day | ORAL | 0 refills | Status: DC

## 2023-12-31 MED ORDER — LISDEXAMFETAMINE DIMESYLATE 30 MG PO CAPS
30.0000 mg | ORAL_CAPSULE | ORAL | 0 refills | Status: DC
  Filled 2023-12-31 – 2024-01-18 (×2): qty 30, 30d supply, fill #0
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
## 2024-01-18 ENCOUNTER — Other Ambulatory Visit (HOSPITAL_BASED_OUTPATIENT_CLINIC_OR_DEPARTMENT_OTHER): Payer: Self-pay

## 2024-02-04 ENCOUNTER — Other Ambulatory Visit: Payer: Self-pay | Admitting: Psychiatry

## 2024-02-18 ENCOUNTER — Other Ambulatory Visit (HOSPITAL_BASED_OUTPATIENT_CLINIC_OR_DEPARTMENT_OTHER): Payer: Self-pay

## 2024-02-18 ENCOUNTER — Other Ambulatory Visit: Payer: Self-pay | Admitting: Psychiatry

## 2024-02-18 DIAGNOSIS — F902 Attention-deficit hyperactivity disorder, combined type: Secondary | ICD-10-CM

## 2024-02-18 MED ORDER — LISDEXAMFETAMINE DIMESYLATE 30 MG PO CAPS
30.0000 mg | ORAL_CAPSULE | ORAL | 0 refills | Status: DC
  Filled 2024-02-18: qty 30, 30d supply, fill #0

## 2024-02-19 ENCOUNTER — Other Ambulatory Visit (HOSPITAL_BASED_OUTPATIENT_CLINIC_OR_DEPARTMENT_OTHER): Payer: Self-pay

## 2024-02-20 ENCOUNTER — Other Ambulatory Visit (HOSPITAL_BASED_OUTPATIENT_CLINIC_OR_DEPARTMENT_OTHER): Payer: Self-pay

## 2024-03-17 ENCOUNTER — Ambulatory Visit (INDEPENDENT_AMBULATORY_CARE_PROVIDER_SITE_OTHER)

## 2024-03-17 ENCOUNTER — Encounter: Payer: Self-pay | Admitting: Podiatry

## 2024-03-17 ENCOUNTER — Ambulatory Visit: Admitting: Podiatry

## 2024-03-17 DIAGNOSIS — M778 Other enthesopathies, not elsewhere classified: Secondary | ICD-10-CM

## 2024-03-17 DIAGNOSIS — M6281 Muscle weakness (generalized): Secondary | ICD-10-CM | POA: Diagnosis not present

## 2024-03-17 DIAGNOSIS — M722 Plantar fascial fibromatosis: Secondary | ICD-10-CM | POA: Diagnosis not present

## 2024-03-17 MED ORDER — METHYLPREDNISOLONE 4 MG PO TBPK: ORAL_TABLET | ORAL | 0 refills | Status: AC

## 2024-03-21 ENCOUNTER — Other Ambulatory Visit: Payer: Self-pay | Admitting: Psychiatry

## 2024-03-21 ENCOUNTER — Other Ambulatory Visit (HOSPITAL_BASED_OUTPATIENT_CLINIC_OR_DEPARTMENT_OTHER): Payer: Self-pay

## 2024-03-21 DIAGNOSIS — F902 Attention-deficit hyperactivity disorder, combined type: Secondary | ICD-10-CM

## 2024-03-21 MED ORDER — LISDEXAMFETAMINE DIMESYLATE 30 MG PO CAPS
30.0000 mg | ORAL_CAPSULE | ORAL | 0 refills | Status: DC
  Filled 2024-03-21: qty 30, 30d supply, fill #0

## 2024-03-21 NOTE — Progress Notes (Signed)
 Subjective:  Patient ID: Gregg King, male    DOB: 29-Dec-1985,  MRN: [id number] HPI Chief Complaint  Patient presents with   Foot Pain    Bilateral feet/ankles - lower back and neck issues for years, radiating pain down legs, right over left, ankles feel weak, unsteady when walking, currently getting checked for Ehlers-Danlos syndrome   New Patient (Initial Visit)    38 y.o. male presents with the above complaint.   ROS: Denies fever chills nausea vomiting.  States he has  muscle aches and pains states that he has had neck injury and back injuries from falling and hitting his head on concrete.  Past Medical History:  Diagnosis Date   ADHD (attention deficit hyperactivity disorder)    Chronic back pain    GERD (gastroesophageal reflux disease)    Past Surgical History:  Procedure Laterality Date   LAPAROSCOPIC APPENDECTOMY N/A 04/04/2013   Procedure: APPENDECTOMY LAPAROSCOPIC;  Surgeon: Keitha Pata, MD;  Location: [facility];  Service: General;  Laterality: N/A;   MANDIBLE SURGERY     WISDOM TOOTH EXTRACTION      Current Outpatient Medications:    methylPREDNISolone  (MEDROL  DOSEPAK) 4 MG TBPK tablet, 6 day dose pack - take as directed, Disp: 21 tablet, Rfl: 0   OLANZapine (ZYPREXA) 10 MG tablet, Take 10 mg by mouth., Disp: , Rfl:    ARIPiprazole  (ABILIFY ) 5 MG tablet, Take 1 tablet (5 mg total) by mouth daily., Disp: 90 tablet, Rfl: 0   busPIRone  (BUSPAR ) 15 MG tablet, Take 2 tablets (30 mg total) by mouth 2 (two) times daily., Disp: 360 tablet, Rfl: 0   diclofenac (VOLTAREN) 50 MG EC tablet, Take 50 mg by mouth 2 (two) times daily., Disp: , Rfl:    DULoxetine (CYMBALTA) 60 MG capsule, TAKE TWO CAPSULES BY MOUTH ONCE DAILY, Disp: 60 capsule, Rfl: 1   lisdexamfetamine (VYVANSE ) 30 MG capsule, Take 1 capsule (30 mg total) by mouth daily., Disp: 30 capsule, Rfl: 0   lisdexamfetamine (VYVANSE ) 30 MG capsule, Take 1 capsule (30 mg total) by mouth daily., Disp: 30 capsule, Rfl: 0    lisdexamfetamine (VYVANSE ) 30 MG capsule, Take 1 capsule (30 mg total) by mouth every morning., Disp: 30 capsule, Rfl: 0   omeprazole (PRILOSEC) 20 MG capsule, Take 20 mg by mouth daily., Disp: , Rfl:    traZODone  (DESYREL ) 50 MG tablet, 1-2 tablets at night as needed for sleep, Disp: 90 tablet, Rfl: 0  Allergies  Allergen Reactions   Ceclor [Cefaclor] Nausea And Vomiting   Review of Systems Objective:  There were no vitals filed for this visit.  General: Well developed, nourished, in no acute distress, alert and oriented x3   Dermatological: Skin is warm, dry and supple bilateral. Nails x 10 are well maintained; remaining integument appears unremarkable at this time. There are no open sores, no preulcerative lesions, no rash or signs of infection present.  Vascular: Dorsalis Pedis artery and Posterior Tibial artery pedal pulses are 2/4 bilateral with immedate capillary fill time. Pedal hair growth present. No varicosities and no lower extremity edema present bilateral.   Neruologic: Grossly intact via light touch bilateral. Vibratory intact via tuning fork bilateral. Protective threshold with Semmes Wienstein monofilament intact to all pedal sites bilateral.  Particularly once he is distracted.  Patellar and Achilles deep tendon reflexes 2+ bilateral.  Again improved with distraction no Babinski or clonus noted bilateral.  Musculoskeletal: No gross boney pedal deformities bilateral. No pain, crepitus, or limitation noted with foot and ankle  range of motion bilateral. Muscular strength 5/5 in all groups tested bilateral.  Muscle strength does appear to be normal and normal acting when he is moving his muscles and feet i.e. spinning his ankles around.  He has a hard time with muscle control and demand through examination.  No significant same site reproducible pain with multiple attempts.  Gait: Unassisted, Nonantalgic.  But he does seem to struggle with timing of musculature and cadence.   Balance appears off.   Radiographs:  Radiographs taken today demonstrate an osseously mature individual with strong healthy bone stock.  Rectus foot type bilateral.  No significant osseous abnormalities small retrocalcaneal heel spur bilaterally as well as a hypertrophic posterior lateral process of the talus.  Assessment & Plan:   Assessment: Question whether or not we have neurological deficit in the bilateral lower extremity that is starting to impede balance and gait.  Plan: Will send him back to neurology for nerve conduction test and EMG bilateral lower extremity I think neuro needs to follow this.  We did prescribe methylprednisolone  to see if this could help with some of his pain in his feet     Boykin Baetz [address] [place], [place]

## 2024-03-22 ENCOUNTER — Other Ambulatory Visit (HOSPITAL_BASED_OUTPATIENT_CLINIC_OR_DEPARTMENT_OTHER): Payer: Self-pay

## 2024-03-30 ENCOUNTER — Ambulatory Visit (INDEPENDENT_AMBULATORY_CARE_PROVIDER_SITE_OTHER): Payer: Commercial Managed Care - PPO | Admitting: Psychiatry

## 2024-03-30 ENCOUNTER — Encounter: Payer: Self-pay | Admitting: Psychiatry

## 2024-03-30 ENCOUNTER — Other Ambulatory Visit (HOSPITAL_BASED_OUTPATIENT_CLINIC_OR_DEPARTMENT_OTHER): Payer: Self-pay

## 2024-03-30 DIAGNOSIS — F902 Attention-deficit hyperactivity disorder, combined type: Secondary | ICD-10-CM

## 2024-03-30 DIAGNOSIS — F5105 Insomnia due to other mental disorder: Secondary | ICD-10-CM

## 2024-03-30 DIAGNOSIS — G8921 Chronic pain due to trauma: Secondary | ICD-10-CM

## 2024-03-30 DIAGNOSIS — F411 Generalized anxiety disorder: Secondary | ICD-10-CM

## 2024-03-30 DIAGNOSIS — Z8659 Personal history of other mental and behavioral disorders: Secondary | ICD-10-CM

## 2024-03-30 DIAGNOSIS — F4010 Social phobia, unspecified: Secondary | ICD-10-CM | POA: Diagnosis not present

## 2024-03-30 MED ORDER — LISDEXAMFETAMINE DIMESYLATE 30 MG PO CAPS
30.0000 mg | ORAL_CAPSULE | Freq: Every day | ORAL | 0 refills | Status: DC
  Filled 2024-03-30 – 2024-04-22 (×2): qty 30, 30d supply, fill #0

## 2024-03-30 MED ORDER — LORAZEPAM 0.5 MG PO TABS: 0.5000 mg | ORAL_TABLET | Freq: Three times a day (TID) | ORAL | 0 refills | Status: DC

## 2024-03-30 MED ORDER — LISDEXAMFETAMINE DIMESYLATE 30 MG PO CAPS
30.0000 mg | ORAL_CAPSULE | Freq: Every day | ORAL | 0 refills | Status: DC
  Filled 2024-05-21: qty 30, 30d supply, fill #0
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
## 2024-04-06 ENCOUNTER — Encounter: Payer: Self-pay | Admitting: Neurology

## 2024-04-06 ENCOUNTER — Ambulatory Visit (INDEPENDENT_AMBULATORY_CARE_PROVIDER_SITE_OTHER): Payer: Commercial Managed Care - PPO | Admitting: Neurology

## 2024-04-06 VITALS — BP 130/85 | HR 101 | Ht 73.0 in | Wt 192.6 lb

## 2024-04-06 DIAGNOSIS — G5603 Carpal tunnel syndrome, bilateral upper limbs: Secondary | ICD-10-CM

## 2024-04-06 DIAGNOSIS — R208 Other disturbances of skin sensation: Secondary | ICD-10-CM

## 2024-04-06 DIAGNOSIS — M549 Dorsalgia, unspecified: Secondary | ICD-10-CM | POA: Diagnosis not present

## 2024-04-06 DIAGNOSIS — M5412 Radiculopathy, cervical region: Secondary | ICD-10-CM | POA: Diagnosis not present

## 2024-04-06 DIAGNOSIS — M4802 Spinal stenosis, cervical region: Secondary | ICD-10-CM

## 2024-04-06 DIAGNOSIS — F419 Anxiety disorder, unspecified: Secondary | ICD-10-CM

## 2024-04-06 DIAGNOSIS — G8929 Other chronic pain: Secondary | ICD-10-CM

## 2024-04-06 NOTE — Progress Notes (Signed)
 [facility]  PATIENT: Gregg King DOB: 07-16-1986  REFERRING DOCTOR OR PCP:  Dr. Annamae Barrett (Ortho); Gray Layman (PCP); Dr. Nathen Balder (psych) SOURCE: Patient, notes from orthopedics, imaging reports, multiple MRI and CT images personally reviewed.  _________________________________   HISTORICAL  CHIEF COMPLAINT:  Chief Complaint  Patient presents with   New Patient (Initial Visit)    RM 11, alone.  Did not complete MRI previously ordered. Was having anxiety/depression issues. Son just diagnosed in last 1.5 wk with type 1 diabetes. He has been trying to handle this. Podiatrist recommending EMG/NCS on legs. Has not taken steroid pack called in.     HISTORY OF PRESENT ILLNESS:  Gregg King is a 38 year old man who had the onset of left arm numbness since 2019.    Update 04/06/2024: He continues to experience some pain in his neck but less in the arms.   He also feels more pain in the buttocks and proximal back of legs.  Aaron Aas  MRI has shown DJD with cervical spinal stenosis and some foraminal narrowing in both cervical and lumbar spine.    He had an ESI by Dr. Allyn Arenas with benefit in the past..  He saw podiatry recently and was prescribed a steroid pack but has not yet taken.  He also has diclofenac but does not take often.  He initially tried gabapentin  for the dysesthesias but due to feeling sedated he was switched to pregabalin  with some benefit.    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     He continues to experience stomach pain but he is not having nausea and vomiting as he did in the past.    He denies difficulty with his bladder/bowel.  He has some bowel urgency (has h/o rectal fistula)   he had surgery.  He has anxiety and depression.  He sees psychiatry..    When he has more pain, his anxiety increases.   He has had some tremoring in the left arm.    In 2019 he started to have left-sided tremors and these  worsened in 2022 with more abnormal movements and fidgeting,  It is unclear if symptoms started around the time Zyprexa was initiated.  In 2023, this was discontinued. He never took the prescribed Cogentin  .  He feels he does worse when anxious  History of arm pain/numbness In late 2019,  he began to experience  tremors in his left arm, often worse with pain and anxiety.     He also experienced numbness from the armpit on down to all his fingers.   He notes a little weakness in arm muscles, in a nonspecific manner.Imaging shows disc protrusions to the left at C3-C4 and C4-C5 and a disc herniation to the left at C5-C6.  These can affect the left C4, C5 and C6 nerve roots.  The spinal cord has normal signal.  In December 2018, he laid on a sewing needle that entered the left shoulder area.   He went to the ED and was told nothing vital was hit.   The needle migrated and he had surgery to remove it 2 weeks later.   He was told that it did not pass to anything vital.  Additionally, he had a few traumas over the past few years.   In summer 2020 he had a fall in the bathroom - hitting his side not the head.   He hit his head on a staircase several  times.   One time 01/29/2019, he hit his head hard and had vertigo but no LOC.     Imaging scans personally reviewed: CT scan brain and cervical spine 02/17/20.  The brain was normal.  Cervical spine showed no bony pathology.  However, disc protrusions to the left were noted at C3-C4, C4-C5 and C5-C6.  MRI of the brain and cervical spine 03/04/2020 showed disc protrusions at C3-C4, C4-C5 to the left and disc herniation to the left at C5-C6.  There was potential for left C6 nerve root compression and to a lesser extent involvement of the C4-C5 nerve roots.  Additionally, there was stenosis of the spinal canal on the left, especially at C5-C6.  The spinal cord has normal signal.  The brain was essentially normal.  MRI cervical spine 06/11/2021 showed: 1.   At C4-C5, there  is mild to moderate spinal stenosis and mild to moderate left foraminal narrowing due to left greater than right disc osteophyte complexes superimposed on disc bulging.  There does not appear to be nerve root compression. 2.   At C5-C6, there is mild spinal stenosis and moderate left foraminal narrowing due to left greater than right disc osteophyte complexes.  This could affect the left C6 nerve root. 3.   Milder degenerative changes at C3-C4, C6-C7 and T1-T2 that do not lead to spinal stenosis or nerve root compression. 4.   3  mm cerebellar ectopia.  This is not enough to be considered a Chiari malformation. 5.   The spinal cord appears normal.  MRI of the lumbar 03/12/2023 showed  1. 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  Severe right foraminal stenosis at this level affecting the exiting  right L3 nerve root. 2. Mild left foraminal stenosis at L3-L4. 3. No significant canal stenosis at any level.  EMG/NCV 05/23/2021 showed mpression: This NCV/EMG study shows the following: Mild chronic left C6 and C7 radiculopathies and mild chronic right C7 radiculopathy.  There were no active features. No evidence of median or ulnar mononeuropathies.  REVIEW OF SYSTEMS: Constitutional: No fevers, chills, sweats, or change in appetite Eyes: No visual changes, double vision, eye pain Ear, nose and throat: No hearing loss, ear pain, nasal congestion, sore throat Cardiovascular: No chest pain, palpitations Respiratory:  No shortness of breath at rest or with exertion.   No wheezes GastrointestinaI: No nausea, vomiting, diarrhea, abdominal pain, fecal incontinence Genitourinary:  No dysuria, urinary retention or frequency.  No nocturia. Musculoskeletal: Neck pain, back pain Integumentary: No rash, pruritus, skin lesions Neurological: as above Psychiatric: Has anxiety greater than depression. Endocrine: No  palpitations, diaphoresis, change in appetite, change in weigh or increased thirst Hematologic/Lymphatic:  No anemia, purpura, petechiae. Allergic/Immunologic: No itchy/runny eyes, nasal congestion, recent allergic reactions, rashes  ALLERGIES: Allergies  Allergen Reactions   Ceclor [Cefaclor] Nausea And Vomiting    HOME MEDICATIONS:  Current Outpatient Medications:    busPIRone  (BUSPAR ) 15 MG tablet, Take 2 tablets (30 mg total) by mouth 2 (two) times daily., Disp: 360 tablet, Rfl: 0   diclofenac (VOLTAREN) 50 MG EC tablet, Take 50 mg by mouth 2 (two) times daily., Disp: , Rfl:    DULoxetine (CYMBALTA) 60 MG capsule, TAKE TWO CAPSULES BY MOUTH ONCE DAILY, Disp: 60 capsule, Rfl: 1   lisdexamfetamine (VYVANSE ) 30 MG capsule, Take 1 capsule (30 mg total) by mouth every morning., Disp: 30 capsule, Rfl: 0   lisdexamfetamine (VYVANSE ) 30 MG capsule,  Take 1 capsule (30 mg total) by mouth daily., Disp: 30 capsule, Rfl: 0   [START ON 04/27/2024] lisdexamfetamine (VYVANSE ) 30 MG capsule, Take 1 capsule (30 mg total) by mouth daily., Disp: 30 capsule, Rfl: 0   LORazepam (ATIVAN) 0.5 MG tablet, Take 1-2 tablets (0.5-1 mg total) by mouth every 8 (eight) hours., Disp: 20 tablet, Rfl: 0   omeprazole (PRILOSEC) 20 MG capsule, Take 20 mg by mouth daily., Disp: , Rfl:    traZODone  (DESYREL ) 50 MG tablet, 1-2 tablets at night as needed for sleep, Disp: 90 tablet, Rfl: 0   methylPREDNISolone  (MEDROL  DOSEPAK) 4 MG TBPK tablet, 6 day dose pack - take as directed (Patient not taking: Reported on 04/06/2024), Disp: 21 tablet, Rfl: 0  PAST MEDICAL HISTORY: Past Medical History:  Diagnosis Date   ADHD (attention deficit hyperactivity disorder)    Chronic back pain    GERD (gastroesophageal reflux disease)     PAST SURGICAL HISTORY: Past Surgical History:  Procedure Laterality Date   LAPAROSCOPIC APPENDECTOMY N/A 04/04/2013   Procedure: APPENDECTOMY LAPAROSCOPIC;  Surgeon: Keitha Pata, MD;  Location: [facility];  Service: General;  Laterality: N/A;   MANDIBLE SURGERY     WISDOM TOOTH EXTRACTION      FAMILY HISTORY: Family History  Problem Relation Age of Onset   Arthritis Paternal Aunt     SOCIAL HISTORY:  Social History   Socioeconomic History   Marital status: Married    Spouse name: Buddie Carina   Number of children: 2   Years of education: 19   Highest education level: Not on file  Occupational History   Occupation: On disability  Tobacco Use   Smoking status: Never   Smokeless tobacco: Never  Substance and Sexual Activity   Alcohol use: Yes    Comment: ocass   Drug use: Yes    Types: Marijuana    Comment: Uses for pain   Sexual activity: Never  Other Topics Concern   Not on file  Social History Narrative   Lives with wife and children   Caffeine use: 200mg  per day    Right handed   Social Drivers of Health   Financial Resource Strain: Not on file  Food Insecurity: Low Risk  (03/18/2024)   Received from [facility]   Hunger Vital Sign    Worried About Running Out of Food in the Last Year: Never true    Ran Out of Food in the Last Year: Never true  Transportation Needs: No Transportation Needs (03/18/2024)   Received from [facility]    In the past 12 months, has lack of reliable transportation kept you from medical appointments, meetings, work or from getting things needed for daily living? : No  Physical Activity: Not on file  Stress: Not on file  Social Connections: Not on file  Intimate Partner Violence: Not on file     PHYSICAL EXAM  Vitals:   04/06/24 1012  BP: 130/85  Pulse: (!) 101  Weight: 192 lb 9.6 oz (87.4 kg)  Height: 6\' 1"  (1.854 m)     Body mass index is 25.41 kg/m.   General: The patient is well-developed and well-nourished .  He is hyperkinetic in his chair and has intermittent left arm tremor.  However, this does appear to be very distractible.  He cracks joints some.   HEENT:  Head is [place]/AT.  Sclera are  anicteric.   Neck: The neck is nontender.  Skin: Extremities are without rash or  edema.   Neurologic Exam  Mental status: The patient is alert and oriented x 3 at the time of the examination. The patient has apparent normal recent and remote memory, with an apparently normal attention span and concentration ability.   Speech is normal.  Cranial nerves: Extraocular movements are full.   Facial symmetry is present. There is good facial sensation to soft touch bilaterally.Facial strength is normal.  Trapezius and sternocleidomastoid strength is normal. No dysarthria is noted.  The tongue is midline, and the patient has symmetric elevation of the soft palate. No obvious hearing deficits are noted.  Motor: He he is fidgeting.  He had some jerking in the left arm and to a lesser extent elsewhere throughout the visit today.  No lipsmacking..  This is very distractible.  Some clear functional enhancement on exam.  .  Muscle bulk is normal.   Tone is normal.  Strength was 5/5 in the limbs  Sensory: Sensory testing was normal in the right arm.  On the left he reported reduced sensation, more over the hypothenar eminence than the thenar eminence.  Coordination: Cerebellar testing reveals good finger-nose-finger and heel-to-shin bilaterally.  Gait and station: Station is normal.   Gait is mildly wide.  Tandem gait was wide.. Romberg is negative.   Reflexes: Deep tendon reflexes are symmetric and normal bilaterally.       DIAGNOSTIC DATA (LABS, IMAGING, TESTING) - I reviewed patient records, labs, notes, testing and imaging myself where available.  Lab Results  Component Value Date   WBC 12.7 (H) 04/04/2013   HGB 15.3 04/04/2013   HCT 45.0 04/04/2013   MCV 87.4 04/04/2013   PLT 198 04/04/2013      Component Value Date/Time   NA 142 04/04/2013 0441   K 3.9 04/04/2013 0441   CL 104 04/04/2013 0441   GLUCOSE 90 04/04/2013 0441   BUN 10 04/04/2013 0441   CREATININE 0.90 04/04/2013 0441    PROT 7.2 04/04/2013 0420   ALBUMIN 4.0 04/04/2013 0420   AST 21 04/04/2013 0420   ALT 15 04/04/2013 0420   ALKPHOS 72 04/04/2013 0420   BILITOT 0.4 04/04/2013 0420      ASSESSMENT AND PLAN  Dysesthesia  Anxiety  Chronic back pain, unspecified back location, unspecified back pain laterality  Left cervical radiculopathy  Bilateral carpal tunnel syndrome  Cervical stenosis of spinal canal  Recommend a steroid pack and taking the prescribed diclofenac for the musculoskeletal pain.   Instructed on lumbar and piriformis exercises.  If not better may need to recheck lumbar MRi   Stay active and exercise as tolerated He will return to see us  as needed and call for new or worsening neurologic symptoms.    Vona Whiters A. Godwin Lat, MD, [facility] 04/06/2024, 7:50 PM Certified in Neurology, Clinical Neurophysiology, Sleep Medicine and Neuroimaging  [facility] [address] [place], [place] [address] [phone]

## 2024-04-14 ENCOUNTER — Other Ambulatory Visit (HOSPITAL_COMMUNITY): Payer: Self-pay

## 2024-04-14 MED ORDER — ONDANSETRON 4 MG PO TBDP
4.0000 mg | ORAL_TABLET | Freq: Three times a day (TID) | ORAL | 0 refills | Status: AC
  Filled 2024-04-14: qty 25, 9d supply, fill #0

## 2024-04-14 MED ORDER — OMEPRAZOLE 40 MG PO CPDR
DELAYED_RELEASE_CAPSULE | ORAL | 3 refills | Status: AC
  Filled 2024-04-14: qty 30, 30d supply, fill #0

## 2024-04-22 ENCOUNTER — Other Ambulatory Visit (HOSPITAL_BASED_OUTPATIENT_CLINIC_OR_DEPARTMENT_OTHER): Payer: Self-pay

## 2024-04-22 ENCOUNTER — Other Ambulatory Visit (HOSPITAL_COMMUNITY): Payer: Self-pay

## 2024-04-25 ENCOUNTER — Other Ambulatory Visit (HOSPITAL_COMMUNITY): Payer: Self-pay

## 2024-05-09 ENCOUNTER — Other Ambulatory Visit: Payer: Self-pay | Admitting: Psychiatry

## 2024-05-09 DIAGNOSIS — F411 Generalized anxiety disorder: Secondary | ICD-10-CM

## 2024-05-09 DIAGNOSIS — F4010 Social phobia, unspecified: Secondary | ICD-10-CM

## 2024-05-21 ENCOUNTER — Other Ambulatory Visit (HOSPITAL_BASED_OUTPATIENT_CLINIC_OR_DEPARTMENT_OTHER): Payer: Self-pay

## 2024-05-23 ENCOUNTER — Other Ambulatory Visit (HOSPITAL_BASED_OUTPATIENT_CLINIC_OR_DEPARTMENT_OTHER): Payer: Self-pay

## 2024-06-01 ENCOUNTER — Telehealth: Admitting: Psychiatry

## 2024-06-01 ENCOUNTER — Encounter: Payer: Self-pay | Admitting: Psychiatry

## 2024-06-01 DIAGNOSIS — F4010 Social phobia, unspecified: Secondary | ICD-10-CM | POA: Diagnosis not present

## 2024-06-01 DIAGNOSIS — F411 Generalized anxiety disorder: Secondary | ICD-10-CM | POA: Diagnosis not present

## 2024-06-01 DIAGNOSIS — Z8659 Personal history of other mental and behavioral disorders: Secondary | ICD-10-CM

## 2024-06-01 DIAGNOSIS — F5105 Insomnia due to other mental disorder: Secondary | ICD-10-CM

## 2024-06-01 DIAGNOSIS — F902 Attention-deficit hyperactivity disorder, combined type: Secondary | ICD-10-CM | POA: Diagnosis not present

## 2024-06-01 DIAGNOSIS — G8921 Chronic pain due to trauma: Secondary | ICD-10-CM | POA: Diagnosis not present
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
## 2024-06-14 ENCOUNTER — Other Ambulatory Visit: Payer: Self-pay | Admitting: Psychiatry

## 2024-06-22 ENCOUNTER — Other Ambulatory Visit (HOSPITAL_BASED_OUTPATIENT_CLINIC_OR_DEPARTMENT_OTHER): Payer: Self-pay

## 2024-06-22 ENCOUNTER — Other Ambulatory Visit: Payer: Self-pay | Admitting: Psychiatry

## 2024-06-22 DIAGNOSIS — F902 Attention-deficit hyperactivity disorder, combined type: Secondary | ICD-10-CM

## 2024-06-22 MED ORDER — LISDEXAMFETAMINE DIMESYLATE 30 MG PO CAPS: 30.0000 mg | ORAL_CAPSULE | Freq: Every day | ORAL | 0 refills | Status: DC

## 2024-06-22 MED ORDER — LISDEXAMFETAMINE DIMESYLATE 30 MG PO CAPS: 30.0000 mg | ORAL_CAPSULE | ORAL | 0 refills | Status: DC

## 2024-06-22 NOTE — Telephone Encounter (Signed)
 Schedule for November

## 2024-06-23 ENCOUNTER — Other Ambulatory Visit (HOSPITAL_BASED_OUTPATIENT_CLINIC_OR_DEPARTMENT_OTHER): Payer: Self-pay

## 2024-06-23 ENCOUNTER — Other Ambulatory Visit: Payer: Self-pay

## 2024-06-23 ENCOUNTER — Telehealth: Payer: Self-pay | Admitting: Psychiatry

## 2024-06-23 DIAGNOSIS — F902 Attention-deficit hyperactivity disorder, combined type: Secondary | ICD-10-CM

## 2024-06-23 MED ORDER — LISDEXAMFETAMINE DIMESYLATE 30 MG PO CAPS
30.0000 mg | ORAL_CAPSULE | ORAL | 0 refills | Status: DC
  Filled 2024-07-25: qty 30, 30d supply, fill #0

## 2024-06-23 MED ORDER — LISDEXAMFETAMINE DIMESYLATE 30 MG PO CAPS
30.0000 mg | ORAL_CAPSULE | Freq: Every day | ORAL | 0 refills | Status: DC
  Filled 2024-09-06: qty 30, 30d supply, fill #0

## 2024-06-23 MED ORDER — LISDEXAMFETAMINE DIMESYLATE 30 MG PO CAPS
30.0000 mg | ORAL_CAPSULE | Freq: Every day | ORAL | 0 refills | Status: DC
  Filled 2024-06-23: qty 30, 30d supply, fill #0

## 2024-06-23 NOTE — Telephone Encounter (Signed)
 Pt called Vyvanse  Rx was sent to [facility]. [facility]. [facility] does not have generic. Apt 11/3

## 2024-06-23 NOTE — Telephone Encounter (Signed)
 Canceled at [facility], repended to [employer]

## 2024-07-20 ENCOUNTER — Other Ambulatory Visit: Payer: Self-pay | Admitting: Psychiatry

## 2024-07-20 DIAGNOSIS — F411 Generalized anxiety disorder: Secondary | ICD-10-CM

## 2024-07-20 DIAGNOSIS — F4010 Social phobia, unspecified: Secondary | ICD-10-CM

## 2024-07-25 ENCOUNTER — Other Ambulatory Visit (HOSPITAL_COMMUNITY): Payer: Self-pay

## 2024-07-25 ENCOUNTER — Other Ambulatory Visit (HOSPITAL_BASED_OUTPATIENT_CLINIC_OR_DEPARTMENT_OTHER): Payer: Self-pay

## 2024-09-06 ENCOUNTER — Other Ambulatory Visit (HOSPITAL_BASED_OUTPATIENT_CLINIC_OR_DEPARTMENT_OTHER): Payer: Self-pay

## 2024-09-12 ENCOUNTER — Encounter: Payer: Self-pay | Admitting: Psychiatry

## 2024-09-12 ENCOUNTER — Other Ambulatory Visit (HOSPITAL_COMMUNITY): Payer: Self-pay

## 2024-09-12 ENCOUNTER — Telehealth: Admitting: Psychiatry

## 2024-09-12 DIAGNOSIS — Z8659 Personal history of other mental and behavioral disorders: Secondary | ICD-10-CM

## 2024-09-12 DIAGNOSIS — F902 Attention-deficit hyperactivity disorder, combined type: Secondary | ICD-10-CM | POA: Diagnosis not present

## 2024-09-12 DIAGNOSIS — F4010 Social phobia, unspecified: Secondary | ICD-10-CM

## 2024-09-12 DIAGNOSIS — F411 Generalized anxiety disorder: Secondary | ICD-10-CM | POA: Diagnosis not present

## 2024-09-12 DIAGNOSIS — G8921 Chronic pain due to trauma: Secondary | ICD-10-CM

## 2024-09-12 DIAGNOSIS — F5105 Insomnia due to other mental disorder: Secondary | ICD-10-CM

## 2024-09-12 MED ORDER — DULOXETINE HCL 60 MG PO CPEP
120.0000 mg | ORAL_CAPSULE | Freq: Every day | ORAL | 1 refills | Status: AC
  Filled 2024-09-12: qty 180, 90d supply, fill #0
  Filled 2024-09-28: qty 60, 30d supply, fill #0
  Filled 2024-10-29: qty 60, 30d supply, fill #1
  Filled 2024-12-02 – 2024-12-03 (×2): qty 60, 30d supply, fill #2

## 2024-09-12 MED ORDER — LORAZEPAM 0.5 MG PO TABS
0.5000 mg | ORAL_TABLET | Freq: Three times a day (TID) | ORAL | 0 refills | Status: AC
  Filled 2024-09-12 – 2024-10-29 (×3): qty 20, 4d supply, fill #0

## 2024-09-12 MED ORDER — LISDEXAMFETAMINE DIMESYLATE 30 MG PO CAPS
30.0000 mg | ORAL_CAPSULE | Freq: Every day | ORAL | 0 refills | Status: AC
  Filled 2024-09-12 – 2024-10-04 (×2): qty 30, 30d supply, fill #0

## 2024-09-12 MED ORDER — LISDEXAMFETAMINE DIMESYLATE 30 MG PO CAPS
30.0000 mg | ORAL_CAPSULE | Freq: Every day | ORAL | 0 refills | Status: AC
  Filled 2024-11-07: qty 30, 30d supply, fill #0

## 2024-09-12 MED ORDER — BUSPIRONE HCL 15 MG PO TABS
30.0000 mg | ORAL_TABLET | Freq: Two times a day (BID) | ORAL | 0 refills | Status: AC
  Filled 2024-09-12 – 2024-09-28 (×2): qty 120, 30d supply, fill #0
  Filled 2024-11-07 (×2): qty 120, 30d supply, fill #1
  Filled 2024-12-03: qty 120, 30d supply, fill #2

## 2024-09-12 MED ORDER — LISDEXAMFETAMINE DIMESYLATE 30 MG PO CAPS
30.0000 mg | ORAL_CAPSULE | ORAL | 0 refills | Status: AC
  Filled 2024-12-12: qty 30, 30d supply, fill #0
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
## 2024-09-14 ENCOUNTER — Other Ambulatory Visit (HOSPITAL_COMMUNITY): Payer: Self-pay

## 2024-09-22 ENCOUNTER — Other Ambulatory Visit (HOSPITAL_COMMUNITY): Payer: Self-pay

## 2024-09-28 ENCOUNTER — Other Ambulatory Visit (HOSPITAL_COMMUNITY): Payer: Self-pay

## 2024-10-04 ENCOUNTER — Other Ambulatory Visit (HOSPITAL_COMMUNITY): Payer: Self-pay

## 2024-10-04 MED ORDER — PREGABALIN 25 MG PO CAPS
25.0000 mg | ORAL_CAPSULE | Freq: Every day | ORAL | 1 refills | Status: AC
  Filled 2024-10-04: qty 60, 30d supply, fill #0

## 2024-10-12 ENCOUNTER — Other Ambulatory Visit (HOSPITAL_COMMUNITY): Payer: Self-pay

## 2024-10-12 ENCOUNTER — Other Ambulatory Visit: Payer: Self-pay

## 2024-10-25 ENCOUNTER — Other Ambulatory Visit (HOSPITAL_COMMUNITY): Payer: Self-pay

## 2024-10-29 ENCOUNTER — Other Ambulatory Visit (HOSPITAL_COMMUNITY): Payer: Self-pay

## 2024-11-07 ENCOUNTER — Other Ambulatory Visit: Payer: Self-pay

## 2024-11-07 ENCOUNTER — Other Ambulatory Visit (HOSPITAL_COMMUNITY): Payer: Self-pay

## 2024-12-02 ENCOUNTER — Other Ambulatory Visit (HOSPITAL_COMMUNITY): Payer: Self-pay

## 2024-12-02 MED ORDER — OSELTAMIVIR PHOSPHATE 75 MG PO CAPS
75.0000 mg | ORAL_CAPSULE | Freq: Two times a day (BID) | ORAL | 0 refills | Status: AC
  Filled 2024-12-02: qty 10, 5d supply, fill #0

## 2024-12-03 ENCOUNTER — Other Ambulatory Visit (HOSPITAL_COMMUNITY): Payer: Self-pay

## 2024-12-04 ENCOUNTER — Other Ambulatory Visit (HOSPITAL_COMMUNITY): Payer: Self-pay

## 2024-12-12 ENCOUNTER — Other Ambulatory Visit (HOSPITAL_COMMUNITY): Payer: Self-pay

## 2024-12-13 ENCOUNTER — Other Ambulatory Visit (HOSPITAL_COMMUNITY): Payer: Self-pay

## 2024-12-13 MED ORDER — AMOXICILLIN-POT CLAVULANATE 875-125 MG PO TABS
1.0000 | ORAL_TABLET | Freq: Two times a day (BID) | ORAL | 0 refills | Status: AC
  Filled 2024-12-13: qty 14, 7d supply, fill #0
